# Patient Record
Sex: Female | Born: 1970 | Race: White | Hispanic: No | Marital: Married | Smoking: Never smoker
Health system: Southern US, Community
[De-identification: ages and names within clinical notes are randomized; demographics above are authoritative.]

## PROBLEM LIST (undated history)

## (undated) DIAGNOSIS — Z1589 Genetic susceptibility to other disease: Secondary | ICD-10-CM

## (undated) DIAGNOSIS — E7212 Methylenetetrahydrofolate reductase deficiency: Secondary | ICD-10-CM

## (undated) DIAGNOSIS — I10 Essential (primary) hypertension: Secondary | ICD-10-CM

## (undated) DIAGNOSIS — M797 Fibromyalgia: Secondary | ICD-10-CM

## (undated) HISTORY — DX: Methylenetetrahydrofolate reductase deficiency: E72.12

## (undated) HISTORY — DX: Genetic susceptibility to other disease: Z15.89

## (undated) HISTORY — PX: OTHER SURGICAL HISTORY: SHX169

## (undated) HISTORY — DX: Fibromyalgia: M79.7

## (undated) HISTORY — DX: Essential (primary) hypertension: I10

---

## 1997-08-12 ENCOUNTER — Other Ambulatory Visit: Admission: RE | Admit: 1997-08-12 | Discharge: 1997-08-12 | Payer: Self-pay | Admitting: Obstetrics & Gynecology

## 1999-11-11 ENCOUNTER — Other Ambulatory Visit: Admission: RE | Admit: 1999-11-11 | Discharge: 1999-11-11 | Payer: Self-pay | Admitting: Obstetrics & Gynecology

## 2000-11-10 ENCOUNTER — Other Ambulatory Visit: Admission: RE | Admit: 2000-11-10 | Discharge: 2000-11-10 | Payer: Self-pay | Admitting: Obstetrics and Gynecology

## 2002-12-05 ENCOUNTER — Other Ambulatory Visit: Admission: RE | Admit: 2002-12-05 | Discharge: 2002-12-05 | Payer: Self-pay | Admitting: Obstetrics and Gynecology

## 2003-12-11 ENCOUNTER — Other Ambulatory Visit: Admission: RE | Admit: 2003-12-11 | Discharge: 2003-12-11 | Payer: Self-pay | Admitting: Obstetrics and Gynecology

## 2004-10-14 ENCOUNTER — Encounter: Admission: RE | Admit: 2004-10-14 | Discharge: 2004-10-14 | Payer: Self-pay | Admitting: Internal Medicine

## 2005-01-19 ENCOUNTER — Other Ambulatory Visit: Admission: RE | Admit: 2005-01-19 | Discharge: 2005-01-19 | Payer: Self-pay | Admitting: Obstetrics and Gynecology

## 2005-08-02 ENCOUNTER — Encounter: Admission: RE | Admit: 2005-08-02 | Discharge: 2005-08-02 | Payer: Self-pay | Admitting: Internal Medicine

## 2006-01-11 HISTORY — PX: OVARIAN CYST REMOVAL: SHX89

## 2006-02-12 ENCOUNTER — Emergency Department (HOSPITAL_COMMUNITY): Admission: EM | Admit: 2006-02-12 | Discharge: 2006-02-13 | Payer: Self-pay | Admitting: Emergency Medicine

## 2006-04-12 HISTORY — PX: HYSTEROSCOPY: SHX211

## 2006-06-24 ENCOUNTER — Encounter: Admission: RE | Admit: 2006-06-24 | Discharge: 2006-06-24 | Payer: Self-pay | Admitting: Internal Medicine

## 2006-08-17 ENCOUNTER — Other Ambulatory Visit: Admission: RE | Admit: 2006-08-17 | Discharge: 2006-08-17 | Payer: Self-pay | Admitting: *Deleted

## 2008-01-15 ENCOUNTER — Other Ambulatory Visit: Admission: RE | Admit: 2008-01-15 | Discharge: 2008-01-15 | Payer: Self-pay | Admitting: Obstetrics & Gynecology

## 2009-07-31 ENCOUNTER — Ambulatory Visit (HOSPITAL_COMMUNITY): Admission: RE | Admit: 2009-07-31 | Discharge: 2009-07-31 | Payer: Self-pay | Admitting: Internal Medicine

## 2009-11-10 ENCOUNTER — Encounter: Payer: Self-pay | Admitting: Obstetrics & Gynecology

## 2009-11-10 ENCOUNTER — Ambulatory Visit (HOSPITAL_COMMUNITY): Admission: RE | Admit: 2009-11-10 | Discharge: 2009-11-11 | Payer: Self-pay | Admitting: Obstetrics & Gynecology

## 2009-11-10 HISTORY — PX: LAPAROSCOPIC TOTAL HYSTERECTOMY: SUR800

## 2010-02-10 ENCOUNTER — Other Ambulatory Visit: Payer: Self-pay | Admitting: Dermatology

## 2010-03-24 LAB — CBC
HCT: 32.4 % — ABNORMAL LOW (ref 36.0–46.0)
Hemoglobin: 11.1 g/dL — ABNORMAL LOW (ref 12.0–15.0)
MCH: 28.2 pg (ref 26.0–34.0)
MCHC: 34.3 g/dL (ref 30.0–36.0)
MCV: 82.1 fL (ref 78.0–100.0)
Platelets: 245 10*3/uL (ref 150–400)
RBC: 3.95 MIL/uL (ref 3.87–5.11)
RDW: 13.6 % (ref 11.5–15.5)
WBC: 10.3 10*3/uL (ref 4.0–10.5)

## 2010-03-25 LAB — CBC
HCT: 36.5 % (ref 36.0–46.0)
Hemoglobin: 12.4 g/dL (ref 12.0–15.0)
MCH: 28 pg (ref 26.0–34.0)
MCHC: 34.1 g/dL (ref 30.0–36.0)
MCV: 82.1 fL (ref 78.0–100.0)
Platelets: 280 10*3/uL (ref 150–400)
RBC: 4.45 MIL/uL (ref 3.87–5.11)
RDW: 13.6 % (ref 11.5–15.5)
WBC: 6.9 10*3/uL (ref 4.0–10.5)

## 2010-03-25 LAB — PREGNANCY, URINE: Preg Test, Ur: NEGATIVE

## 2010-03-25 LAB — SURGICAL PCR SCREEN
MRSA, PCR: NEGATIVE
Staphylococcus aureus: POSITIVE — AB

## 2010-06-15 ENCOUNTER — Other Ambulatory Visit: Payer: Self-pay | Admitting: Internal Medicine

## 2010-06-15 DIAGNOSIS — K219 Gastro-esophageal reflux disease without esophagitis: Secondary | ICD-10-CM

## 2010-06-17 ENCOUNTER — Ambulatory Visit
Admission: RE | Admit: 2010-06-17 | Discharge: 2010-06-17 | Disposition: A | Discharge status: Home / Self Care | Payer: 59 | Source: Ambulatory Visit | Attending: Internal Medicine | Admitting: Internal Medicine

## 2010-06-17 DIAGNOSIS — K219 Gastro-esophageal reflux disease without esophagitis: Secondary | ICD-10-CM

## 2011-02-12 DIAGNOSIS — M797 Fibromyalgia: Secondary | ICD-10-CM

## 2011-02-12 HISTORY — DX: Fibromyalgia: M79.7

## 2011-06-23 ENCOUNTER — Other Ambulatory Visit: Payer: Self-pay | Admitting: Obstetrics & Gynecology

## 2011-06-23 DIAGNOSIS — Z1231 Encounter for screening mammogram for malignant neoplasm of breast: Secondary | ICD-10-CM

## 2011-07-01 ENCOUNTER — Ambulatory Visit (HOSPITAL_COMMUNITY)
Admission: RE | Admit: 2011-07-01 | Discharge: 2011-07-01 | Disposition: A | Discharge status: Home / Self Care | Payer: 59 | Source: Ambulatory Visit | Attending: Obstetrics & Gynecology | Admitting: Obstetrics & Gynecology

## 2011-07-01 DIAGNOSIS — Z1231 Encounter for screening mammogram for malignant neoplasm of breast: Secondary | ICD-10-CM | POA: Insufficient documentation

## 2011-07-16 ENCOUNTER — Ambulatory Visit (HOSPITAL_COMMUNITY): Payer: 59

## 2011-07-21 ENCOUNTER — Ambulatory Visit (HOSPITAL_COMMUNITY): Payer: 59

## 2013-02-07 ENCOUNTER — Encounter: Payer: Self-pay | Admitting: Gynecology

## 2013-02-07 DIAGNOSIS — M797 Fibromyalgia: Secondary | ICD-10-CM | POA: Insufficient documentation

## 2013-02-12 ENCOUNTER — Ambulatory Visit: Payer: Self-pay | Admitting: Gynecology

## 2013-03-12 ENCOUNTER — Encounter: Payer: Self-pay | Admitting: Obstetrics & Gynecology

## 2013-03-12 ENCOUNTER — Ambulatory Visit (INDEPENDENT_AMBULATORY_CARE_PROVIDER_SITE_OTHER): Payer: 59 | Admitting: Obstetrics & Gynecology

## 2013-03-12 VITALS — BP 122/78 | HR 64 | Resp 16 | Ht 64.75 in | Wt 173.4 lb

## 2013-03-12 DIAGNOSIS — Z01419 Encounter for gynecological examination (general) (routine) without abnormal findings: Secondary | ICD-10-CM

## 2013-03-12 DIAGNOSIS — Z0000 Encounter for general adult medical examination without abnormal findings: Secondary | ICD-10-CM

## 2013-03-12 LAB — POCT URINALYSIS DIPSTICK
Bilirubin, UA: NEGATIVE
Glucose, UA: NEGATIVE
Ketones, UA: NEGATIVE
Leukocytes, UA: NEGATIVE
Nitrite, UA: NEGATIVE
Protein, UA: NEGATIVE
Urobilinogen, UA: NEGATIVE
pH, UA: 5

## 2013-03-12 MED ORDER — FLUTICASONE PROPIONATE 50 MCG/ACT NA SUSP: 2.0000 | Freq: Every day | NASAL | Status: DC

## 2013-03-12 NOTE — Patient Instructions (Signed)

## 2013-03-12 NOTE — Progress Notes (Signed)
43 y.o. [id number] MarriedCaucasianF here for annual exam.  Work has been much better.  Stress has been much better.  No vaginal bleeding.  Having sinus issues today.  Had floors redone at home and this cause allergies to flare.  Doing reflexology.  Seeing Tammy Worrell as well.  Energy is really good.  Has MTHFR gene mutation.    Patient's last menstrual period was 10/13/2009.          Sexually active: yes  The current method of family planning is vasectomy and TLH Exercising: yes  yoga Smoker:  no  Health Maintenance: Pap:  03/14/09 WNL History of abnormal Pap:  no MMG:  10/23/12 normal Colonoscopy:  none BMD:   none TDaP:  Aware is due, will call back to schedule this later-not feeling well today Screening Labs: PCP, Hb today: PCP, Urine today: RBC-1+   reports that she has never smoked. She has never used smokeless tobacco. She reports that she does not drink alcohol or use illicit drugs.  Past Medical History  Diagnosis Date  . Fibromyalgia 2/13  . Methylenetetrahydrofolate reductase (MTHFR) gene mutation     Past Surgical History  Procedure Laterality Date  . Ovarian cyst removal  2008  . Hysteroscopy  04/2006    D&C   . Abdominal hysterectomy  11/10/09    TLH    Current Outpatient Prescriptions  Medication Sig Dispense Refill  . cetirizine (ZYRTEC) 10 MG tablet Take 10 mg by mouth daily. As needed      . cholecalciferol (VITAMIN D) 1000 UNITS tablet Take 1,000 Units by mouth. 2 daily      . guaiFENesin (MUCINEX) 600 MG 12 hr tablet Take 600 mg by mouth.      . NON FORMULARY MMB daily       No current facility-administered medications for this visit.    Family History  Problem Relation Age of Onset  . Hypertension Father   . Thyroid disease Father     ROS:  Pertinent items are noted in HPI.  Otherwise, a comprehensive ROS was negative.  Exam:   BP 122/78  Pulse 64  Resp 16  Ht 5' 4.75" (1.645 m)  Wt 173 lb 6.4 oz (78.654 kg)  BMI 29.07 kg/m2  LMP  10/13/2009  Weight change: +2lb  Height: 5' 4.75" (164.5 cm)  Ht Readings from Last 3 Encounters:  03/12/13 5' 4.75" (1.645 m)    General appearance: alert, cooperative and appears stated age Head: Normocephalic, without obvious abnormality, atraumatic Neck: no adenopathy, supple, symmetrical, trachea midline and thyroid normal to inspection and palpation Lungs: clear to auscultation bilaterally Breasts: normal appearance, no masses or tenderness Heart: regular rate and rhythm Abdomen: soft, non-tender; bowel sounds normal; no masses,  no organomegaly Extremities: extremities normal, atraumatic, no cyanosis or edema Skin: Skin color, texture, turgor normal. No rashes or lesions Lymph nodes: Cervical, supraclavicular, and axillary nodes normal. No abnormal inguinal nodes palpated Neurologic: Grossly normal   Pelvic: External genitalia:  no lesions              Urethra:  normal appearing urethra with no masses, tenderness or lesions              Bartholins and Skenes: normal                 Vagina: normal appearing vagina with normal color and discharge, no lesions              Cervix: absent  Pap taken: no Bimanual Exam:  Uterus:  uterus absent              Adnexa: normal adnexa and no mass, fullness, tenderness               Rectovaginal: Confirms               Anus:  normal sphincter tone, no lesions  A:  Well Woman with normal exam Fibromyalgia H/O microscopic hematuria on dip u/a, urinalysis was negative MTHFR heterozygous gene positive testing 2014 Recent sinus issues  P:   Mammogram yearly. pap smear not indicated. Labs with Dr. Chalmers Cater, Tammy Worrell Flonase rx to pharmacy. return annually or prn  An After Visit Summary was printed and given to the patient.

## 2013-11-12 ENCOUNTER — Encounter: Payer: Self-pay | Admitting: Obstetrics & Gynecology

## 2014-03-22 ENCOUNTER — Ambulatory Visit (INDEPENDENT_AMBULATORY_CARE_PROVIDER_SITE_OTHER): Payer: 59 | Admitting: Obstetrics & Gynecology

## 2014-03-22 ENCOUNTER — Encounter: Payer: Self-pay | Admitting: Obstetrics & Gynecology

## 2014-03-22 VITALS — BP 104/64 | HR 80 | Resp 16 | Ht 64.75 in | Wt 171.4 lb

## 2014-03-22 DIAGNOSIS — Z01419 Encounter for gynecological examination (general) (routine) without abnormal findings: Secondary | ICD-10-CM | POA: Diagnosis not present

## 2014-03-22 NOTE — Progress Notes (Signed)
44 y.o. [id number] MarriedCaucasianF here for annual exam.  Doing well.  States she feels really well.  No vaginal bleeding.    Did blood work last work.  Panel was done last week.    Patient's last menstrual period was 10/13/2009.          Sexually active: Yes.    The current method of family planning is vasectomy and status post hysterectomy.    Exercising: Yes.    yoga and walking Smoker:  no  Health Maintenance: Pap:  03/14/09 WNL History of abnormal Pap:  no MMG:  12/29/13 3D-MMG, 01/07/14 left breast US-normal Colonoscopy:  none BMD:   none TDaP:  declines Screening Labs: with MMB, Hb today: with MMB, Urine today: declined   reports that she has never smoked. She has never used smokeless tobacco. She reports that she drinks alcohol. She reports that she does not use illicit drugs.  Past Medical History  Diagnosis Date  . Fibromyalgia 2/13  . Methylenetetrahydrofolate reductase (MTHFR) gene mutation     heterozygous    Past Surgical History  Procedure Laterality Date  . Ovarian cyst removal  2008  . Hysteroscopy  04/2006    D&C   . Laparoscopic total hysterectomy  11/10/09    TLH    Current Outpatient Prescriptions  Medication Sig Dispense Refill  . cholecalciferol (VITAMIN D) 1000 UNITS tablet Take 1,000 Units by mouth. 2 daily    . NON FORMULARY MMB daily     No current facility-administered medications for this visit.    Family History  Problem Relation Age of Onset  . Hypertension Father   . Thyroid disease Father     ROS:  Pertinent items are noted in HPI.  Otherwise, a comprehensive ROS was negative.  Exam:   General appearance: alert, cooperative and appears stated age Head: Normocephalic, without obvious abnormality, atraumatic Neck: no adenopathy, supple, symmetrical, trachea midline and thyroid normal to inspection and palpation Lungs: clear to auscultation bilaterally Breasts: normal appearance, no masses or tenderness Heart: regular rate and  rhythm Abdomen: soft, non-tender; bowel sounds normal; no masses,  no organomegaly Extremities: extremities normal, atraumatic, no cyanosis or edema Skin: Skin color, texture, turgor normal. No rashes or lesions Lymph nodes: Cervical, supraclavicular, and axillary nodes normal. No abnormal inguinal nodes palpated Neurologic: Grossly normal   Pelvic: External genitalia:  no lesions              Urethra:  normal appearing urethra with no masses, tenderness or lesions              Bartholins and Skenes: normal                 Vagina: normal appearing vagina with normal color and discharge, no lesions              Cervix: absent              Pap taken: No. Bimanual Exam:  Uterus:  uterus absent              Adnexa: normal adnexa and no mass, fullness, tenderness               Rectovaginal: Confirms               Anus:  normal sphincter tone, no lesions  Chaperone was present for exam.  A:  Well Woman with normal exam Fibromyalgia MTHFR heterozygous gene positive testing 2014 Dense breast tissue  P: Mammogram yearly. pap smear not  indicated. Labs with Dr. Chalmers Cater, Tammy Worrell Flonase rx to pharmacy. return annually or prn

## 2015-01-12 HISTORY — PX: LIPOSUCTION: SHX10

## 2015-01-12 HISTORY — PX: BREAST REDUCTION SURGERY: SHX8

## 2015-05-27 ENCOUNTER — Encounter: Payer: Self-pay | Admitting: Obstetrics & Gynecology

## 2015-05-27 ENCOUNTER — Ambulatory Visit (INDEPENDENT_AMBULATORY_CARE_PROVIDER_SITE_OTHER): Payer: 59 | Admitting: Obstetrics & Gynecology

## 2015-05-27 VITALS — BP 98/64 | HR 88 | Resp 14 | Ht 64.75 in | Wt 163.2 lb

## 2015-05-27 DIAGNOSIS — Z01419 Encounter for gynecological examination (general) (routine) without abnormal findings: Secondary | ICD-10-CM | POA: Diagnosis not present

## 2015-05-27 NOTE — Patient Instructions (Signed)
Jade Gosling, MD.  [place].

## 2015-05-27 NOTE — Progress Notes (Signed)
45 y.o. [id number] MarriedCaucasianF here for annual exam.  Doing well.  Had breast reduction.  Also, did liposuction on back and abdomen.  Saw a Dr. Berline Lopes and was so pleased.    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    Patient's last menstrual period was 10/13/2009.          Sexually active: Yes.    The current method of family planning is status post hysterectomy.    Exercising: Yes.    walking and yoga Smoker:  no  Health Maintenance: Pap:  3/11 History of abnormal Pap:  no MMG:  01/22/15 BIRADS 1 negative  Colonoscopy:  none BMD:   none TDaP:  declined  Screening Labs: declined, Hb today: declined, Urine today: declined   reports that she has never smoked. She has never used smokeless tobacco. She reports that she drinks alcohol. She reports that she does not use illicit drugs.  Past Medical History  Diagnosis Date  . Fibromyalgia 2/13  . Methylenetetrahydrofolate reductase (MTHFR) gene mutation     heterozygous    Past Surgical History  Procedure Laterality Date  . Ovarian cyst removal  2008  . Hysteroscopy  04/2006    D&C   . Laparoscopic total hysterectomy  11/10/09    TLH    Current Outpatient Prescriptions  Medication Sig Dispense Refill  . cholecalciferol (VITAMIN D) 1000 UNITS tablet Take 1,000 Units by mouth. 2 daily    . NON FORMULARY MMB daily     No current facility-administered medications for this visit.    Family History  Problem Relation Age of Onset  . Hypertension Father   . Thyroid disease Father     ROS:  Pertinent items are noted in HPI.  Otherwise, a comprehensive ROS was negative.  Exam:   General appearance: alert, cooperative and appears stated age Head: Normocephalic, without obvious abnormality, atraumatic Neck: no adenopathy, supple, symmetrical, trachea midline and thyroid normal to inspection and palpation Lungs: clear to auscultation bilaterally Breasts: normal appearance, no masses or tenderness Heart: regular rate and  rhythm Abdomen: soft, non-tender; bowel sounds normal; no masses,  no organomegaly Extremities: extremities normal, atraumatic, no cyanosis or edema Skin: Skin color, texture, turgor normal. No rashes or lesions Lymph nodes: Cervical, supraclavicular, and axillary nodes normal. No abnormal inguinal nodes palpated Neurologic: Grossly normal   Pelvic: External genitalia:  no lesions              Urethra:  normal appearing urethra with no masses, tenderness or lesions              Bartholins and Skenes: normal                 Vagina: normal appearing vagina with normal color and discharge, no lesions              Cervix: absent              Pap taken: No. Bimanual Exam:  Uterus:  uterus absent              Adnexa: normal adnexa and no mass, fullness, tenderness               Rectovaginal: Confirms               Anus:  normal sphincter tone, no lesions  Chaperone was present for exam.  A:  Well Woman with normal exam Fibromyalgia Recent breast reduction and liposuction MTHFR heterozygous gene positive testing 2014 Dense breast tissue  P: Mammogram yearly.  Doing 3D MMG. pap smear not indicated Labs with [facility] Kinds.  Name given to establish care with new PCP.  Prior PCP, Dr. Minna Antis, moved to [place]. Return annually or prn

## 2015-10-09 ENCOUNTER — Telehealth: Payer: Self-pay | Admitting: Obstetrics & Gynecology

## 2015-10-09 NOTE — Telephone Encounter (Signed)
Spoke with patient. Patient request name of PCP provided by Dr. Sabra Heck at last AEX. Provided patient with info: Dr. Billey Gosling at [place]; [phone].   Routing to provider for final review. Patient is agreeable to disposition. Will close encounter.

## 2015-10-09 NOTE — Telephone Encounter (Signed)
Patient was given the name of a pcp at her last visit but can not remember the name.

## 2016-02-02 DIAGNOSIS — J029 Acute pharyngitis, unspecified: Secondary | ICD-10-CM | POA: Diagnosis not present

## 2016-02-04 DIAGNOSIS — R05 Cough: Secondary | ICD-10-CM | POA: Diagnosis not present

## 2016-02-04 DIAGNOSIS — J029 Acute pharyngitis, unspecified: Secondary | ICD-10-CM | POA: Diagnosis not present

## 2016-05-12 DIAGNOSIS — R5383 Other fatigue: Secondary | ICD-10-CM | POA: Diagnosis not present

## 2016-05-21 HISTORY — PX: LIPOSUCTION: SHX10

## 2016-06-21 ENCOUNTER — Ambulatory Visit (INDEPENDENT_AMBULATORY_CARE_PROVIDER_SITE_OTHER): Payer: 59 | Admitting: Obstetrics & Gynecology

## 2016-06-21 ENCOUNTER — Encounter: Payer: Self-pay | Admitting: Obstetrics & Gynecology

## 2016-06-21 ENCOUNTER — Other Ambulatory Visit (HOSPITAL_COMMUNITY)
Admission: RE | Admit: 2016-06-21 | Discharge: 2016-06-21 | Disposition: A | Discharge status: Home / Self Care | Payer: 59 | Source: Ambulatory Visit | Attending: Obstetrics & Gynecology | Admitting: Obstetrics & Gynecology

## 2016-06-21 VITALS — BP 122/68 | HR 80 | Resp 16 | Ht 64.75 in | Wt 168.0 lb

## 2016-06-21 DIAGNOSIS — Z86010 Personal history of colonic polyps: Secondary | ICD-10-CM

## 2016-06-21 DIAGNOSIS — Z01419 Encounter for gynecological examination (general) (routine) without abnormal findings: Secondary | ICD-10-CM | POA: Insufficient documentation

## 2016-06-21 DIAGNOSIS — Z124 Encounter for screening for malignant neoplasm of cervix: Secondary | ICD-10-CM | POA: Diagnosis not present

## 2016-06-21 NOTE — Progress Notes (Signed)
46 y.o. [id number] MarriedCaucasianF here for annual exam.  Doing well.  Had additional liposuction done about a month ago.  Doing PT to help with lumpiness after the procedure.    Denies vaginal bleeding.  D/W pt new guideline for colonoscopy screening from [place].  Pt reports having colon polyps in her 20's after her first delivery (around 1998).  She had hemorrhoids and the polyps were discovered when evaluation and treatment was being done.  This is new information to me.  Feel referral appropriate at this time.    Had blood work done this summer.  Patient's last menstrual period was 10/13/2009.          Sexually active: Yes.    The current method of family planning is status post hysterectomy.    Exercising: Yes.    light weights and walking Smoker:  no  Health Maintenance: Pap:  03/14/09 Normal  History of abnormal Pap:  no MMG:  01/22/15 BIRADS1:neg -- patient will schedule Colonoscopy:  never BMD:   Never TDaP:  Done in the 90s per patient; patient declines Pneumonia vaccine(s):  N/A Zostavax:   N/A Hep C testing: N/A Screening Labs: PCP takes care of labs   reports that she has never smoked. She has never used smokeless tobacco. She reports that she drinks alcohol. She reports that she does not use drugs.  Past Medical History:  Diagnosis Date  . Fibromyalgia 2/13  . 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 BREAST REDUCTION SURGERY  01/2015   . HYSTEROSCOPY  04/2006   D&C   . LAPAROSCOPIC TOTAL HYSTERECTOMY  11/10/09   TLH  . LIPOSUCTION  01/2015    full torso   . LIPOSUCTION  05/21/2016  . OVARIAN CYST REMOVAL  2008    Current Outpatient Prescriptions  Medication Sig Dispense Refill  . NON FORMULARY MMB daily     No current facility-administered medications for this visit.     Family History  Problem Relation Age of Onset  . Hypertension Father    . Thyroid disease Father   . Diabetes Father   . Diabetes Mother     ROS:  Pertinent items are noted in HPI.  Otherwise, a comprehensive ROS was negative.  Exam:   BP 122/68 (BP Location: Right Arm, Patient Position: Sitting, Cuff Size: Normal)   Pulse 80   Resp 16   Ht 5' 4.75" (1.645 m)   Wt 168 lb (76.2 kg)   LMP 10/13/2009   BMI 28.17 kg/m   Height: 5' 4.75" (164.5 cm)  Ht Readings from Last 3 Encounters:  06/21/16 5' 4.75" (1.645 m)  05/27/15 5' 4.75" (1.645 m)  03/22/14 5' 4.75" (1.645 m)   General appearance: alert, cooperative and appears stated age Head: Normocephalic, without obvious abnormality, atraumatic Neck: no adenopathy, supple, symmetrical, trachea midline and thyroid normal to inspection and palpation Lungs: clear to auscultation bilaterally Breasts: normal appearance, no masses or tenderness Heart: regular rate and rhythm Abdomen: soft, non-tender; bowel sounds normal; no masses,  no organomegaly Extremities: extremities normal, atraumatic, no cyanosis or edema Skin: Skin color, texture, turgor normal. No rashes or lesions Lymph nodes: Cervical, supraclavicular, and axillary nodes normal. No abnormal inguinal nodes palpated Neurologic: Grossly normal   Pelvic: External genitalia:  no lesions              Urethra:  normal appearing urethra with no masses, tenderness or  lesions              Bartholins and Skenes: normal                 Vagina: normal appearing vagina with normal color and discharge, no lesions              Cervix: absent              Pap taken: Yes.   Bimanual Exam:  Uterus:  uterus absent              Adnexa: normal adnexa and no mass, fullness, tenderness               Rectovaginal: Confirms               Anus:  normal sphincter tone, no lesions  Chaperone was present for exam.  A:  Well Woman with normal exam Fibromyalgia H/O breast reduction and liposuction x 2 MTHRF heterozygous gene positive testing 2014 Dense breast  tissue, doing 3D MMG  P:   Mammogram yearly.  Pt aware this is due. pap smear obtained today Plan lipid testing next year Referral for possible colonoscopy Return annually or prn

## 2016-06-22 LAB — CYTOLOGY - PAP: Diagnosis: NEGATIVE

## 2016-07-01 DIAGNOSIS — Z8379 Family history of other diseases of the digestive system: Secondary | ICD-10-CM | POA: Diagnosis not present

## 2016-07-01 DIAGNOSIS — Z1211 Encounter for screening for malignant neoplasm of colon: Secondary | ICD-10-CM | POA: Diagnosis not present

## 2016-07-05 DIAGNOSIS — R05 Cough: Secondary | ICD-10-CM | POA: Diagnosis not present

## 2016-07-05 DIAGNOSIS — J069 Acute upper respiratory infection, unspecified: Secondary | ICD-10-CM | POA: Diagnosis not present

## 2016-07-23 DIAGNOSIS — Z1231 Encounter for screening mammogram for malignant neoplasm of breast: Secondary | ICD-10-CM | POA: Diagnosis not present

## 2016-09-03 ENCOUNTER — Ambulatory Visit: Payer: 59 | Admitting: Obstetrics & Gynecology

## 2016-09-29 ENCOUNTER — Encounter: Payer: Self-pay | Admitting: Obstetrics & Gynecology

## 2017-01-21 DIAGNOSIS — M9903 Segmental and somatic dysfunction of lumbar region: Secondary | ICD-10-CM | POA: Diagnosis not present

## 2017-01-21 DIAGNOSIS — M9905 Segmental and somatic dysfunction of pelvic region: Secondary | ICD-10-CM | POA: Diagnosis not present

## 2017-01-21 DIAGNOSIS — M9902 Segmental and somatic dysfunction of thoracic region: Secondary | ICD-10-CM | POA: Diagnosis not present

## 2017-02-22 DIAGNOSIS — L820 Inflamed seborrheic keratosis: Secondary | ICD-10-CM | POA: Diagnosis not present

## 2017-02-22 DIAGNOSIS — D225 Melanocytic nevi of trunk: Secondary | ICD-10-CM | POA: Diagnosis not present

## 2017-02-22 DIAGNOSIS — L814 Other melanin hyperpigmentation: Secondary | ICD-10-CM | POA: Diagnosis not present

## 2017-03-16 ENCOUNTER — Telehealth: Payer: Self-pay | Admitting: Obstetrics & Gynecology

## 2017-03-16 NOTE — Telephone Encounter (Signed)
Patient thinks she "has something going on with her thyroid". Patient would like blood work.

## 2017-03-16 NOTE — Telephone Encounter (Signed)
Left message to call Sharee Pimple at [phone].

## 2017-03-16 NOTE — Telephone Encounter (Signed)
Spoke with patient.Reports recent changes in hair texture noted by hairdresser. Patient reports soreness in left side of neck when turning and pressing, no lumps, "feels like swelling is deep". Patient is concerned d/t family hx of thyroid cancer, requesting OV.   Denies fever/chill, cold symptoms.  Recommended patient not to press on area of concern.  OV scheduled for 03/21/17 at 2:30pm with Dr. Sabra Heck. Patient declined appt offered for 3/11.   Routing to provider for final review. Patient is agreeable to disposition. Will close encounter.

## 2017-03-21 NOTE — Telephone Encounter (Addendum)
Call to patient. Advised that we are happy to see patient as scheduled; however, based on symptoms would likely need to refer her to another provider for additional evaluation.  Any testing, even if normal, would still warrant PCP follow-up. Patient states she prefers to come here first and will go to another provider after seeing Dr Sabra Heck. Appointment for 03-24-17 as previously scheduled.

## 2017-03-21 NOTE — Telephone Encounter (Signed)
Gay Filler, Would you review this?  Pt has appt on Thursday but I think PCP may be a better option. Thanks.

## 2017-03-24 ENCOUNTER — Encounter: Payer: Self-pay | Admitting: Obstetrics & Gynecology

## 2017-03-24 ENCOUNTER — Ambulatory Visit (INDEPENDENT_AMBULATORY_CARE_PROVIDER_SITE_OTHER): Payer: 59 | Admitting: Obstetrics & Gynecology

## 2017-03-24 VITALS — BP 120/88 | HR 84 | Temp 98.2°F | Resp 14 | Wt 167.0 lb

## 2017-03-24 DIAGNOSIS — L679 Hair color and hair shaft abnormality, unspecified: Secondary | ICD-10-CM

## 2017-03-24 DIAGNOSIS — R599 Enlarged lymph nodes, unspecified: Secondary | ICD-10-CM | POA: Diagnosis not present

## 2017-03-24 NOTE — Progress Notes (Signed)
GYNECOLOGY  VISIT  CC:   H/o enlarged lymph node  HPI: 47 y.o. [id number] Married Caucasian female here for swollen lymp node/neck mass that she noted last week.  Noted it was tender as well.  She did have a severe URI about two weeks ago.  Took mucinex and tylenol sinus.  Wasn't on antibiotics and Pt called for appt.  Was advised should see PCP.  She was adamant about being seen here.    GYNECOLOGIC HISTORY: Patient's last menstrual period was 10/13/2009. Contraception: hysterectomy  Menopausal hormone therapy: none  Patient Active Problem List   Diagnosis Date Noted  . Fibromyalgia     Past Medical History:  Diagnosis Date  . Fibromyalgia 2/13  . 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 BREAST REDUCTION SURGERY  01/2015   . HYSTEROSCOPY  04/2006   D&C   . LAPAROSCOPIC TOTAL HYSTERECTOMY  11/10/09   TL[facility]  . LIPOSUCTION  01/2015    full torso   . LIPOSUCTION  05/21/2016  . OVARIAN CYST REMOVAL  2008    MEDS:   Current Outpatient Medications on File Prior to Visit  Medication Sig Dispense Refill  . NON FORMULARY MMB daily     No current facility-administered medications on file prior to visit.     ALLERGIES: Azithromycin; Codeine; and Synthroid [levothyroxine sodium]  Family History  Problem Relation Age of Onset  . Hypertension Father   . Thyroid disease Father   . Diabetes Father   . Diabetes Mother     SH:  Married, non smoker  Review of Systems  All other systems reviewed and are negative.   PHYSICAL EXAMINATION:    BP 120/88 (BP Location: Right Arm, Patient Position: Sitting, Cuff Size: Normal)   Pulse 84   Temp 98.2 F (36.8 C) (Oral)   Resp 14   Wt 167 lb (75.8 kg)   LMP 10/13/2009   BMI 28.01 kg/m      Physical Exam  Constitutional: She is oriented to person, place, and time. She appears well-developed and well-nourished.  Neck: Normal range of  motion. Neck supple. No tracheal deviation present. No thyromegaly present.  Lymphadenopathy:    She has no cervical adenopathy.  Neurological: She is alert and oriented to person, place, and time.  Skin: Skin is warm and dry.  Psychiatric: She has a normal mood and affect.   Assessment: H/o enlarged lymph node Hair changes  Plan: CBC with diff will be obtained today TSH with panel, T4

## 2017-03-24 NOTE — Addendum Note (Signed)
Addended by: Megan Salon on: 03/24/2017 01:46 PM   Modules accepted: Orders

## 2017-03-25 LAB — CBC WITH DIFFERENTIAL/PLATELET
Basophils Absolute: 0 10*3/uL (ref 0.0–0.2)
Basos: 0 %
EOS (ABSOLUTE): 0.3 10*3/uL (ref 0.0–0.4)
Eos: 3 %
Hematocrit: 40.1 % (ref 34.0–46.6)
Hemoglobin: 13.4 g/dL (ref 11.1–15.9)
Immature Grans (Abs): 0 10*3/uL (ref 0.0–0.1)
Immature Granulocytes: 0 %
Lymphocytes Absolute: 2.3 10*3/uL (ref 0.7–3.1)
Lymphs: 28 %
MCH: 27.9 pg (ref 26.6–33.0)
MCHC: 33.4 g/dL (ref 31.5–35.7)
MCV: 83 fL (ref 79–97)
Monocytes Absolute: 0.4 10*3/uL (ref 0.1–0.9)
Monocytes: 5 %
Neutrophils Absolute: 5.2 10*3/uL (ref 1.4–7.0)
Neutrophils: 64 %
Platelets: 329 10*3/uL (ref 150–379)
RBC: 4.81 x10E6/uL (ref 3.77–5.28)
RDW: 13.9 % (ref 12.3–15.4)
WBC: 8.2 10*3/uL (ref 3.4–10.8)

## 2017-03-25 LAB — THYROID PANEL WITH TSH
Free Thyroxine Index: 2.1 (ref 1.2–4.9)
T3 Uptake Ratio: 27 % (ref 24–39)
T4, Total: 7.8 ug/dL (ref 4.5–12.0)
TSH: 2.91 u[IU]/mL (ref 0.450–4.500)

## 2017-03-25 LAB — T4, FREE: Free T4: 1.43 ng/dL (ref 0.82–1.77)

## 2017-06-23 ENCOUNTER — Ambulatory Visit: Payer: 59 | Admitting: Obstetrics & Gynecology

## 2017-08-05 ENCOUNTER — Ambulatory Visit: Payer: 59 | Admitting: Obstetrics & Gynecology

## 2017-08-08 ENCOUNTER — Ambulatory Visit: Payer: 59 | Admitting: Obstetrics & Gynecology

## 2017-08-08 ENCOUNTER — Other Ambulatory Visit: Payer: Self-pay

## 2017-08-08 ENCOUNTER — Encounter: Payer: Self-pay | Admitting: Obstetrics & Gynecology

## 2017-08-08 VITALS — BP 120/76 | HR 68 | Resp 16 | Ht 64.75 in | Wt 167.2 lb

## 2017-08-08 DIAGNOSIS — Z1211 Encounter for screening for malignant neoplasm of colon: Secondary | ICD-10-CM

## 2017-08-08 DIAGNOSIS — Z01419 Encounter for gynecological examination (general) (routine) without abnormal findings: Secondary | ICD-10-CM

## 2017-08-08 DIAGNOSIS — E7889 Other lipoprotein metabolism disorders: Secondary | ICD-10-CM | POA: Diagnosis not present

## 2017-08-08 DIAGNOSIS — Z0000 Encounter for general adult medical examination without abnormal findings: Secondary | ICD-10-CM

## 2017-08-08 NOTE — Progress Notes (Signed)
47 y.o. [id number] MarriedCaucasianF here for annual exam.  Doing well.  In the past three months, she's had new itching inside her breast.  This is only in her left breast.    Denies vaginal bleeding.  PCP: Horald Pollen  Patient's last menstrual period was 10/13/2009.          Sexually active: Yes.    The current method of family planning is status post hysterectomy.    Exercising: Yes.    walking, light weights  Smoker:  no  Health Maintenance: Pap:  06/21/16 neg  History of abnormal Pap:  no MMG:  07/23/16 BIRADS1:Neg. Had one 08/05/17 Colonoscopy:  Never BMD:   Never TDaP:  Due. Pt declines.  Risks  Screening Labs: PCP   reports that she has never smoked. She has never used smokeless tobacco. She reports that she drinks about 0.6 oz of alcohol per week. She reports that she does not use drugs.  Past Medical History:  Diagnosis Date  . Fibromyalgia 2/13  . 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 BREAST REDUCTION SURGERY  01/2015   . HYSTEROSCOPY  04/2006   D&C   . LAPAROSCOPIC TOTAL HYSTERECTOMY  11/10/09   [facility]  . LIPOSUCTION  01/2015    full torso   . LIPOSUCTION  05/21/2016  . OVARIAN CYST REMOVAL  2008    Current Outpatient Medications  Medication Sig Dispense Refill  . NON FORMULARY MMB daily     No current facility-administered medications for this visit.     Family History  Problem Relation Age of Onset  . Hypertension Father   . Thyroid disease Father   . Diabetes Father   . Diabetes Mother     Review of Systems  All other systems reviewed and are negative.   Exam:   BP 120/76 (BP Location: Right Arm, Patient Position: Sitting, Cuff Size: Normal)   Pulse 68   Resp 16   Ht 5' 4.75" (1.645 m)   Wt 167 lb 3.2 oz (75.8 kg)   LMP 10/13/2009   BMI 28.04 kg/m     Height: 5' 4.75" (164.5 cm)  Ht Readings from Last 3 Encounters:  08/08/17 5' 4.75" (1.645  m)  06/21/16 5' 4.75" (1.645 m)  05/27/15 5' 4.75" (1.645 m)    General appearance: alert, cooperative and appears stated age Head: Normocephalic, without obvious abnormality, atraumatic Neck: no adenopathy, supple, symmetrical, trachea midline and thyroid normal to inspection and palpation Lungs: clear to auscultation bilaterally Breasts: normal appearance, no masses or tenderness Heart: regular rate and rhythm Abdomen: soft, non-tender; bowel sounds normal; no masses,  no organomegaly Extremities: extremities normal, atraumatic, no cyanosis or edema Skin: Skin color, texture, turgor normal. No rashes or lesions Lymph nodes: Cervical, supraclavicular, and axillary nodes normal. No abnormal inguinal nodes palpated Neurologic: Grossly normal   Pelvic: External genitalia:  no lesions              Urethra:  normal appearing urethra with no masses, tenderness or lesions              Bartholins and Skenes: normal                 Vagina: normal appearing vagina with normal color and discharge, no lesions              Cervix: absent  Pap taken: No. Bimanual Exam:  Uterus:  uterus absent              Adnexa: no mass, fullness, tenderness               Rectovaginal: Confirms               Anus:  normal sphincter tone, no lesions  Chaperone was present for exam.  A:  Well Woman with normal exam Fibromyalgia H/O breast reduction and liposuction x 2 MTHFR heterozygous gene positive testing 2014 Dense breast tissue Breast itching  P:   Mammogram done on Friday pap smear neg 2018 IFOB given today Lab work obtained today:  CMP, Lipids, Vit D Recommended she follow up with breast surgeon as well Tdap due.  She declines this right now. return annually or prn

## 2017-08-09 LAB — COMPREHENSIVE METABOLIC PANEL
A/G RATIO: 1.7 (ref 1.2–2.2)
ALK PHOS: 53 IU/L (ref 39–117)
ALT: 11 IU/L (ref 0–32)
AST: 11 IU/L (ref 0–40)
Albumin: 4.5 g/dL (ref 3.5–5.5)
BUN/Creatinine Ratio: 15 (ref 9–23)
BUN: 13 mg/dL (ref 6–24)
Bilirubin Total: 0.4 mg/dL (ref 0.0–1.2)
CALCIUM: 9.2 mg/dL (ref 8.7–10.2)
CO2: 24 mmol/L (ref 20–29)
Chloride: 101 mmol/L (ref 96–106)
Creatinine, Ser: 0.86 mg/dL (ref 0.57–1.00)
GFR calc Af Amer: 94 mL/min/{1.73_m2} (ref >59)
GFR, EST NON AFRICAN AMERICAN: 81 mL/min/{1.73_m2} (ref >59)
Globulin, Total: 2.7 g/dL (ref 1.5–4.5)
Glucose: 103 mg/dL — ABNORMAL HIGH (ref 65–99)
Potassium: 4.7 mmol/L (ref 3.5–5.2)
Sodium: 139 mmol/L (ref 134–144)
Total Protein: 7.2 g/dL (ref 6.0–8.5)

## 2017-08-09 LAB — LIPID PANEL
CHOL/HDL RATIO: 5.4 ratio — AB (ref 0.0–4.4)
CHOLESTEROL TOTAL: 236 mg/dL — AB (ref 100–199)
HDL: 44 mg/dL (ref >39)
LDL Calculated: 132 mg/dL — ABNORMAL HIGH (ref 0–99)
TRIGLYCERIDES: 299 mg/dL — AB (ref 0–149)
VLDL Cholesterol Cal: 60 mg/dL — ABNORMAL HIGH (ref 5–40)

## 2017-08-09 LAB — VITAMIN D 25 HYDROXY (VIT D DEFICIENCY, FRACTURES): Vit D, 25-Hydroxy: 15.9 ng/mL — ABNORMAL LOW (ref 30.0–100.0)

## 2017-08-11 ENCOUNTER — Other Ambulatory Visit: Payer: Self-pay | Admitting: *Deleted

## 2017-08-11 DIAGNOSIS — R7301 Impaired fasting glucose: Secondary | ICD-10-CM

## 2017-08-11 DIAGNOSIS — E559 Vitamin D deficiency, unspecified: Secondary | ICD-10-CM

## 2017-08-11 MED ORDER — VITAMIN D (ERGOCALCIFEROL) 1.25 MG (50000 UNIT) PO CAPS: 50000.0000 [IU] | ORAL_CAPSULE | ORAL | 0 refills | Status: DC

## 2017-08-13 NOTE — Addendum Note (Signed)
Addended by: Megan Salon on: 08/13/2017 02:24 AM   Modules accepted: Orders

## 2017-08-25 ENCOUNTER — Encounter: Payer: Self-pay | Admitting: Obstetrics & Gynecology

## 2017-08-30 ENCOUNTER — Telehealth: Payer: Self-pay | Admitting: Obstetrics & Gynecology

## 2017-08-30 NOTE — Telephone Encounter (Signed)
Please advise her not to take this in this dosage again.  Once symptoms resolve, she can restart this with 2000 to 3000 IU daily.  Just take OTC for this.

## 2017-08-30 NOTE — Telephone Encounter (Signed)
Spoke with patient. Took Vit D 50,000 IU on 8/18, this was her 2nd dose. Patient reports dizziness, nausea, weakness, feeling groggy and "heart palpitations" on 8/19. At work today, dizziness has resolved, "heart palpitation" still present.  Feels anxious, read a lot about vitamin D  "overdose" and is concerned. Applied magnesium to feet and hands last night and this morning, has increased Gatorade intake.   Recommended patient stop Vit D. Offered OV today with Dr. Sabra Heck, patient declined. Advised to continue to monitor symptoms, if symptoms worsen, new symptoms develop or symptoms do not resolve, return call to office, Urgent care or ER after hours. Will review with Dr. Sabra Heck and return call with additional recommendations.   Dr. Sabra Heck -please advise.

## 2017-08-30 NOTE — Telephone Encounter (Signed)
Patient started taking 50,000 units of vitamin D and over the weekend started feeling nauseus, weak and dizzy.

## 2017-08-30 NOTE — Telephone Encounter (Signed)
Spoke with patient, advised per Dr. Sabra Heck. Patient verbalizes understanding and is agreeable. Encounter closed.

## 2017-11-17 ENCOUNTER — Other Ambulatory Visit (INDEPENDENT_AMBULATORY_CARE_PROVIDER_SITE_OTHER): Payer: 59

## 2017-11-17 DIAGNOSIS — R7301 Impaired fasting glucose: Secondary | ICD-10-CM

## 2017-11-17 DIAGNOSIS — E7889 Other lipoprotein metabolism disorders: Secondary | ICD-10-CM

## 2017-11-17 DIAGNOSIS — E559 Vitamin D deficiency, unspecified: Secondary | ICD-10-CM

## 2017-11-18 LAB — HEMOGLOBIN A1C
Est. average glucose Bld gHb Est-mCnc: 111 mg/dL
HEMOGLOBIN A1C: 5.5 % (ref 4.8–5.6)

## 2017-11-18 LAB — LIPID PANEL
CHOL/HDL RATIO: 4.6 ratio — AB (ref 0.0–4.4)
Cholesterol, Total: 196 mg/dL (ref 100–199)
HDL: 43 mg/dL (ref >39)
LDL CALC: 115 mg/dL — AB (ref 0–99)
TRIGLYCERIDES: 192 mg/dL — AB (ref 0–149)
VLDL Cholesterol Cal: 38 mg/dL (ref 5–40)

## 2017-11-18 LAB — VITAMIN D 25 HYDROXY (VIT D DEFICIENCY, FRACTURES): VIT D 25 HYDROXY: 18.2 ng/mL — AB (ref 30.0–100.0)

## 2017-11-21 ENCOUNTER — Telehealth: Payer: Self-pay | Admitting: *Deleted

## 2017-11-21 NOTE — Telephone Encounter (Signed)
Spoke with patinet and message from Dr. Sabra Heck given.  She verbalizes understanding of results.  Will follow up as scheduled.  She states she is not taking Vit D 50k IU due to side effects but she will continue with her OTC vit D.

## 2017-11-21 NOTE — Telephone Encounter (Signed)
-----   Message from Megan Salon, MD sent at 11/18/2017  5:50 PM EST ----- Please let pt know her cholesterol was 196 (was 236 with last test).  Triglycerides were 299 and were 192.  LDls were 546 and were 115.  Ok to watch.  Needs to do fasting lipid tests only in the future.  Diabetes testing was negative as HbA1C was 5.5.  Lastly.  Vit D was 18.  This is mildly increased from the level of 15 obtained 3 months ago.  She should continue the 50K weekly dosing and I will retest this again at her AEX.

## 2017-11-21 NOTE — Telephone Encounter (Signed)
LM for pt to call back. Please route to Triage if I am not available.

## 2018-02-09 ENCOUNTER — Other Ambulatory Visit: Payer: Self-pay | Admitting: Family Medicine

## 2018-02-09 DIAGNOSIS — R1032 Left lower quadrant pain: Secondary | ICD-10-CM

## 2018-02-10 ENCOUNTER — Other Ambulatory Visit: Payer: 59

## 2018-02-10 ENCOUNTER — Other Ambulatory Visit: Payer: Self-pay | Admitting: Family Medicine

## 2018-02-10 DIAGNOSIS — R319 Hematuria, unspecified: Secondary | ICD-10-CM

## 2018-02-10 DIAGNOSIS — R1032 Left lower quadrant pain: Secondary | ICD-10-CM

## 2018-02-14 ENCOUNTER — Telehealth: Payer: Self-pay | Admitting: Obstetrics & Gynecology

## 2018-02-14 NOTE — Telephone Encounter (Signed)
Agree with recommendations.  Precautions have been given.  Ok to close encounter.

## 2018-02-14 NOTE — Telephone Encounter (Signed)
Patient is calling regarding UTI symptoms. Patient stated that she is experiencing cramping, pain in her lower back, and burning when urinating.

## 2018-02-14 NOTE — Telephone Encounter (Signed)
Spoke with patient. Patient requesting OV with Dr. Sabra Heck. Patient states she went to her new PCP 1.5 wks ago for lower back pain, intermittent nausea, fatigue, dull pelvic cramping and burning with urination. Patient states UA was done, waited in exam room for 1 hr and left, not seen. MD called patient later that day to advise UA positive for blood, neg for infection, possible kidney stones, recommended CT scan. Patient states she has not heard from PCP for scheduling CT. Reports symptoms have not resolved or worsened. Denies fever/chills or vaginal bleeding.   Patient declined OV with covering provider on 2/5, OV scheduled for 2/6 at 1:30pm with Dr. Sabra Heck. ER/urgent care precautions provided for new or worsening symptoms. Advised I will review with Dr. Sabra Heck and return call if any additional recommendations. Patient agreeable.   Dr. Sabra Heck -please review.

## 2018-02-16 ENCOUNTER — Ambulatory Visit: Payer: 59 | Admitting: Obstetrics & Gynecology

## 2018-02-16 ENCOUNTER — Encounter: Payer: Self-pay | Admitting: Obstetrics & Gynecology

## 2018-02-16 ENCOUNTER — Other Ambulatory Visit: Payer: Self-pay

## 2018-02-16 VITALS — BP 128/88 | HR 96 | Resp 14 | Ht 64.75 in | Wt 174.0 lb

## 2018-02-16 DIAGNOSIS — R350 Frequency of micturition: Secondary | ICD-10-CM

## 2018-02-16 LAB — POCT URINALYSIS DIPSTICK
Bilirubin, UA: NEGATIVE
Glucose, UA: NEGATIVE
Ketones, UA: NEGATIVE
LEUKOCYTES UA: NEGATIVE
NITRITE UA: NEGATIVE
PH UA: 6 (ref 5.0–8.0)
PROTEIN UA: NEGATIVE
UROBILINOGEN UA: 0.2 U/dL

## 2018-02-16 MED ORDER — SULFAMETHOXAZOLE-TRIMETHOPRIM 800-160 MG PO TABS: 1.0000 | ORAL_TABLET | Freq: Two times a day (BID) | ORAL | 0 refills | Status: DC

## 2018-02-16 MED ORDER — FLUCONAZOLE 150 MG PO TABS: 150.0000 mg | ORAL_TABLET | Freq: Once | ORAL | 0 refills | Status: AC

## 2018-02-16 NOTE — Addendum Note (Signed)
Addended by: Polly Cobia on: 02/16/2018 02:17 PM   Modules accepted: Orders

## 2018-02-16 NOTE — Progress Notes (Signed)
GYNECOLOGY  VISIT  CC:   UTI symptoms, heart palpitations.   HPI: 48 y.o. G40P2002 Married Caucasian female here for low back pain, cramping, urinary frequency x 1 week that she says started the weekend of January 25th/26th.  She had increased urinary urgency and remembers being a movie and having to get up three time to go to the bathroom.  She did see a PCP and needed to leave due to having such a long wait.    Denies having any new sexual partners.  Married.  Denies fever.  She is having some low back pain.  She is also having some overall weakness.    Denies vaginal bleeding.    GYNECOLOGIC HISTORY: Patient's last menstrual period was 10/13/2009. Contraception: hysterectomy  Menopausal hormone therapy: none  Patient Active Problem List   Diagnosis Date Noted  . Fibromyalgia     Past Medical History:  Diagnosis Date  . Fibromyalgia 2/13  . 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 BREAST REDUCTION SURGERY  01/2015   . HYSTEROSCOPY  04/2006   D&C   . LAPAROSCOPIC TOTAL HYSTERECTOMY  11/10/09   TLH  . LIPOSUCTION  01/2015    full torso   . LIPOSUCTION  05/21/2016  . OVARIAN CYST REMOVAL  2008    MEDS:   Current Outpatient Medications on File Prior to Visit  Medication Sig Dispense Refill  . NON FORMULARY MMB daily     No current facility-administered medications on file prior to visit.     ALLERGIES: Azithromycin; Codeine; Ergocalciferol; and Synthroid [levothyroxine sodium]  Family History  Problem Relation Age of Onset  . Hypertension Father   . Thyroid disease Father   . Diabetes Father   . Diabetes Mother     SH:  Married, non smoker  Review of Systems  Cardiovascular: Positive for palpitations.  Genitourinary: Positive for frequency.  Musculoskeletal:       Muscle weakness   All other systems reviewed and are negative.   PHYSICAL EXAMINATION:    BP  128/88 (BP Location: Left Arm, Patient Position: Sitting, Cuff Size: Large)   Pulse 96   Resp 14   Ht 5' 4.75" (1.645 m)   Wt 174 lb (78.9 kg)   LMP 10/13/2009   BMI 29.18 kg/m     General appearance: alert, cooperative and appears stated age CV:  Regular rate and rhythm Abdomen: soft, non-tender; bowel sounds normal; no masses,  no organomegaly Lymph:  no inguinal LAD noted  Pelvic: External genitalia:  no lesions              Urethra:  normal appearing urethra with no masses, tenderness or lesions              Bartholins and Skenes: normal                 Vagina: normal appearing vagina with normal color and discharge, no lesions              Cervix: absent              Bimanual Exam:  Uterus:  uterus absent              Adnexa: no mass, fullness, tenderness  Chaperone was present for exam.  Assessment: Urinary urgency Microscopic hematuria  Plan: Urine micro and culture pending Bactrim DS bid x 5 days OTC azo recommended. Diflucan to pharmacy if  needed

## 2018-02-17 ENCOUNTER — Telehealth: Payer: Self-pay | Admitting: Obstetrics & Gynecology

## 2018-02-17 MED ORDER — NITROFURANTOIN MONOHYD MACRO 100 MG PO CAPS: 100.0000 mg | ORAL_CAPSULE | Freq: Two times a day (BID) | ORAL | 0 refills | Status: AC

## 2018-02-17 NOTE — Telephone Encounter (Signed)
Patient believes she is experiencing a reaction to Bactrim that she started yesterday. Patient stated that she is experiencing tingling in her fingers and "really bad itching." Call transferred to Glorianne Manchester, RN.

## 2018-02-17 NOTE — Telephone Encounter (Signed)
Spoke with patient, seen in office on 2/6 for UTI symptoms, tx with Bactrim DS. Took one dose of abx last night, reports itching all over her body, tingling and numbness in fingertips, and sore throat. Patient reports symptoms have resolved, she has not taken anymore Bactrim DS. Denies wheezing or SOB. Advised patient to discontinue medication, will review with Dr. Sabra Heck and return call with recommendations, patient agreeable.   Dr. Sabra Heck -please review and advise on alternative abx.

## 2018-02-17 NOTE — Telephone Encounter (Signed)
Ok to change to macrobid 174m bid x 5 days.  Please put bactrim in as an allergy.

## 2018-02-17 NOTE — Telephone Encounter (Signed)
Left detailed message, ok per dpr. Advised new Rx for macrobid 161m bid x5days sent to [facility] Allergies updated. Return call to office if any additional questions/concerns.   Encounter closed.

## 2018-02-18 LAB — URINE CULTURE: Organism ID, Bacteria: NO GROWTH

## 2018-02-18 LAB — URINALYSIS, MICROSCOPIC ONLY
Bacteria, UA: NONE SEEN
Casts: NONE SEEN /lpf
WBC, UA: NONE SEEN /hpf (ref 0–5)

## 2018-03-26 ENCOUNTER — Encounter (HOSPITAL_COMMUNITY): Payer: Self-pay

## 2018-03-26 ENCOUNTER — Other Ambulatory Visit: Payer: Self-pay

## 2018-03-26 ENCOUNTER — Emergency Department (HOSPITAL_COMMUNITY)
Admission: EM | Admit: 2018-03-26 | Discharge: 2018-03-26 | Disposition: A | Discharge status: Home / Self Care | Payer: 59 | Attending: Emergency Medicine | Admitting: Emergency Medicine

## 2018-03-26 DIAGNOSIS — R197 Diarrhea, unspecified: Secondary | ICD-10-CM

## 2018-03-26 DIAGNOSIS — R531 Weakness: Secondary | ICD-10-CM | POA: Insufficient documentation

## 2018-03-26 DIAGNOSIS — R112 Nausea with vomiting, unspecified: Secondary | ICD-10-CM | POA: Diagnosis present

## 2018-03-26 DIAGNOSIS — E860 Dehydration: Secondary | ICD-10-CM | POA: Diagnosis not present

## 2018-03-26 LAB — CBC
HCT: 47.4 % — ABNORMAL HIGH (ref 36.0–46.0)
Hemoglobin: 15.3 g/dL — ABNORMAL HIGH (ref 12.0–15.0)
MCH: 27.9 pg (ref 26.0–34.0)
MCHC: 32.3 g/dL (ref 30.0–36.0)
MCV: 86.3 fL (ref 80.0–100.0)
PLATELETS: 259 10*3/uL (ref 150–400)
RBC: 5.49 MIL/uL — ABNORMAL HIGH (ref 3.87–5.11)
RDW: 13.1 % (ref 11.5–15.5)
WBC: 13.8 10*3/uL — ABNORMAL HIGH (ref 4.0–10.5)
nRBC: 0 % (ref 0.0–0.2)

## 2018-03-26 LAB — COMPREHENSIVE METABOLIC PANEL
ALK PHOS: 55 U/L (ref 38–126)
ALT: 16 U/L (ref 0–44)
AST: 22 U/L (ref 15–41)
Albumin: 4.4 g/dL (ref 3.5–5.0)
Anion gap: 13 (ref 5–15)
BUN: 23 mg/dL — ABNORMAL HIGH (ref 6–20)
CALCIUM: 9.2 mg/dL (ref 8.9–10.3)
CO2: 20 mmol/L — AB (ref 22–32)
Chloride: 104 mmol/L (ref 98–111)
Creatinine, Ser: 0.81 mg/dL (ref 0.44–1.00)
GFR calc Af Amer: >60 mL/min (ref >60)
GFR calc non Af Amer: >60 mL/min (ref >60)
Glucose, Bld: 145 mg/dL — ABNORMAL HIGH (ref 70–99)
Potassium: 4.3 mmol/L (ref 3.5–5.1)
Sodium: 137 mmol/L (ref 135–145)
Total Bilirubin: 0.6 mg/dL (ref 0.3–1.2)
Total Protein: 7.9 g/dL (ref 6.5–8.1)

## 2018-03-26 LAB — URINALYSIS, ROUTINE W REFLEX MICROSCOPIC
Bacteria, UA: NONE SEEN
Bilirubin Urine: NEGATIVE
Glucose, UA: NEGATIVE mg/dL
Hgb urine dipstick: NEGATIVE
Ketones, ur: NEGATIVE mg/dL
Nitrite: NEGATIVE
Protein, ur: NEGATIVE mg/dL
Specific Gravity, Urine: 1.019 (ref 1.005–1.030)
pH: 6 (ref 5.0–8.0)

## 2018-03-26 LAB — LIPASE, BLOOD: Lipase: 27 U/L (ref 11–51)

## 2018-03-26 MED ORDER — SODIUM CHLORIDE 0.9 % IV BOLUS
1000.0000 mL | Freq: Once | INTRAVENOUS | Status: AC
  Administered 2018-03-26: 1000 mL via INTRAVENOUS

## 2018-03-26 MED ORDER — ONDANSETRON HCL 4 MG/2ML IJ SOLN
4.0000 mg | Freq: Once | INTRAMUSCULAR | Status: AC
  Administered 2018-03-26: 4 mg via INTRAVENOUS
  Filled 2018-03-26: qty 2

## 2018-03-26 MED ORDER — MORPHINE SULFATE (PF) 4 MG/ML IV SOLN: 4.0000 mg | Freq: Once | INTRAVENOUS | Status: DC

## 2018-03-26 MED ORDER — ONDANSETRON HCL 4 MG PO TABS: 4.0000 mg | ORAL_TABLET | Freq: Three times a day (TID) | ORAL | 0 refills | Status: DC | PRN

## 2018-03-26 NOTE — ED Notes (Signed)
Pt states she would let us known when she was able to provide a urine sample. States she feels too dehydrated to urinate at this time.

## 2018-03-26 NOTE — Discharge Instructions (Signed)
You were seen in the emergency department for acute onset of nausea vomiting and diarrhea.  Your lab work was fairly normal and your symptoms were improved with some nausea medication and fluids.  You can continue to use the Zofran every 6-8 hours as needed.  You can use Imodium for diarrhea.  Please try to keep well-hydrated and advance her diet as tolerated.  Follow-up with your doctor return if any worsening symptoms.

## 2018-03-26 NOTE — ED Notes (Signed)
Pt was given fluid to PO challenge.

## 2018-03-26 NOTE — ED Triage Notes (Addendum)
Patient arrived via POV with Husband at bedside. Patient is AOx4 and ambulatory however patient is extremely weak so wheelchair was used for patient. Patient chief complaint is N/V/D and severe weakness with patient stating she feels like she is going to pass out which started this morning around 0600. Patient has had numerous episodes of N/V/D.

## 2018-03-26 NOTE — ED Provider Notes (Signed)
[place] DEPT Provider Note   CSN: [id number] Arrival date & time: 03/26/18  1000    History   Chief Complaint Chief Complaint  Patient presents with  . N/V/D  . Weakness    HPI Jade Daniels is a 48 y.o. female.  48 year old female here with acute onset of nausea vomiting diarrhea that started at 6 AM today.  She says it is coming out of both hands at the same time.  She feels generally weak and chilled.  No known fever.  No recent travel.  She ate a group meal at somebody's home with spaghetti and salad last evening and her husband had the same thing and is not feeling sick.  She is tried nothing for it.  She has had a partial hysterectomy.  No recent antibiotics or C. difficile risk factors.     The history is provided by the patient.  Emesis  Severity:  Severe Duration:  5 hours Timing:  Constant Quality:  Stomach contents Progression:  Unchanged Chronicity:  New Recent urination:  Normal Context: not post-tussive and not self-induced   Relieved by:  None tried Worsened by:  Nothing Ineffective treatments:  None tried Associated symptoms: abdominal pain, chills and diarrhea   Associated symptoms: no cough, no fever, no headaches, no myalgias, no sore throat and no URI     Past Medical History:  Diagnosis Date  . Fibromyalgia 2/13  . Methylenetetrahydrofolate reductase (MTHFR) gene mutation [facility])    heterozygous    Patient Active Problem List   Diagnosis Date Noted  . Fibromyalgia     Past Surgical History:  Procedure Laterality Date  . BREAST REDUCTION SURGERY  01/2015   . HYSTEROSCOPY  04/2006   D&C   . LAPAROSCOPIC TOTAL HYSTERECTOMY  11/10/09   TLH  . LIPOSUCTION  01/2015    full torso   . LIPOSUCTION  05/21/2016  . OVARIAN CYST REMOVAL  2008     OB History    Gravida  2   Para  2   Term  2   Preterm      AB      Living  2     SAB      TAB      Ectopic      Multiple      Live Births              Home Medications    Prior to Admission medications   Medication Sig Start Date End Date Taking? Authorizing Provider  NON FORMULARY MMB daily    [provider]    Family History Family History  Problem Relation Age of Onset  . Hypertension Father   . Thyroid disease Father   . Diabetes Father   . Diabetes Mother     Social History Social History   Tobacco Use  . Smoking status: Never Smoker  . Smokeless tobacco: Never Used  Substance Use Topics  . Alcohol use: Yes    Alcohol/week: 1.0 standard drinks    Types: 1 Glasses of wine per week    Comment: wine once/twice month  . Drug use: No     Allergies   Azithromycin; Bactrim [sulfamethoxazole-trimethoprim]; Codeine; Ergocalciferol; and Synthroid [levothyroxine sodium]   Review of Systems Review of Systems  Constitutional: Positive for chills. Negative for fever.  HENT: Negative for sore throat.   Eyes: Negative for visual disturbance.  Respiratory: Negative for cough and shortness of breath.   Cardiovascular: Negative for  chest pain.  Gastrointestinal: Positive for abdominal pain, diarrhea and vomiting.  Genitourinary: Negative for dysuria.  Musculoskeletal: Negative for myalgias.  Skin: Negative for rash.  Neurological: Negative for headaches.     Physical Exam Updated Vital Signs BP 108/62 (BP Location: Left Arm)   Pulse 73   Temp (!) 97.5 F (36.4 C) (Oral)   Resp 20   Ht 5' 4"  (1.626 m)   Wt 77.6 kg   LMP 10/13/2009   SpO2 100%   BMI 29.35 kg/m   Physical Exam Vitals signs and nursing note reviewed.  Constitutional:      General: She is not in acute distress.    Appearance: She is well-developed.  HENT:     Head: Normocephalic and atraumatic.  Eyes:     Conjunctiva/sclera: Conjunctivae normal.  Neck:     Musculoskeletal: Neck supple.  Cardiovascular:     Rate and Rhythm: Normal rate and regular rhythm.     Heart sounds: No murmur.  Pulmonary:     Effort: Pulmonary  effort is normal. No respiratory distress.     Breath sounds: Normal breath sounds.  Abdominal:     Palpations: Abdomen is soft.     Tenderness: There is no abdominal tenderness. There is no guarding or rebound.  Musculoskeletal: Normal range of motion.     Right lower leg: No edema.     Left lower leg: No edema.  Skin:    General: Skin is warm and dry.     Capillary Refill: Capillary refill takes less than 2 seconds.  Neurological:     General: No focal deficit present.     Mental Status: She is alert and oriented to person, place, and time.     Motor: Weakness (generalized) present.      ED Treatments / Results  Labs (all labs ordered are listed, but only abnormal results are displayed) Labs Reviewed  COMPREHENSIVE METABOLIC PANEL - Abnormal; Notable for the following components:      Result Value   CO2 20 (*)    Glucose, Bld 145 (*)    BUN 23 (*)    All other components within normal limits  CBC - Abnormal; Notable for the following components:   WBC 13.8 (*)    RBC 5.49 (*)    Hemoglobin 15.3 (*)    HCT 47.4 (*)    All other components within normal limits  URINALYSIS, ROUTINE W REFLEX MICROSCOPIC - Abnormal; Notable for the following components:   Leukocytes,Ua TRACE (*)    All other components within normal limits  URINE CULTURE  LIPASE, BLOOD    EKG EKG Interpretation  Date/Time:  Sunday March 26 2018 10:20:38 EDT Ventricular Rate:  76 PR Interval:    QRS Duration: 79 QT Interval:  389 QTC Calculation: 438 R Axis:   55 Text Interpretation:  Sinus rhythm no acute st/ts No old tracing to compare Confirmed by Aletta Edouard [phone]) on 03/26/2018 10:27:54 AM   Radiology No results found.  Procedures Procedures (including critical care time)  Medications Ordered in ED Medications  sodium chloride 0.9 % bolus 1,000 mL (0 mLs Intravenous Stopped 03/26/18 1246)  ondansetron (ZOFRAN) injection 4 mg (4 mg Intravenous Given 03/26/18 1101)  sodium chloride  0.9 % bolus 1,000 mL (0 mLs Intravenous Stopped 03/26/18 1547)     Initial Impression / Assessment and Plan / ED Course  I have reviewed the triage vital signs and the nursing notes.  Pertinent labs & imaging results that were available  during my care of the patient were reviewed by me and considered in my medical decision making (see chart for details).  Clinical Course as of Mar 26 636  Sun Mar 26, 6014  2063 48 year old female with acute onset of nausea vomiting diarrhea.  No sick contacts no recent travel.  Patient feels generally weak.  Afebrile here initial WBC is slightly elevated.  Electrolytes okay CO2 at 20 so likely a little clamped down.  She is receiving some IV fluids and nausea medication looking better already.  Will give her some more fluids and p.o. trial.   [MB]  1513 Patient feeling better and is tolerating p.o.  She is comfortable going home and understands to follow-up with her doctor return if any worsening symptoms.   [MB]    Clinical Course User Index [MB] Hayden Rasmussen, MD        Final Clinical Impressions(s) / ED Diagnoses   Final diagnoses:  Nausea vomiting and diarrhea  Dehydration    ED Discharge Orders         Ordered    ondansetron (ZOFRAN) 4 MG tablet  Every 8 hours PRN     03/26/18 1515           Hayden Rasmussen, MD 03/27/18 [phone]

## 2018-03-27 LAB — URINE CULTURE: Culture: NO GROWTH

## 2018-05-26 ENCOUNTER — Emergency Department (HOSPITAL_COMMUNITY)
Admission: EM | Admit: 2018-05-26 | Discharge: 2018-05-26 | Disposition: A | Discharge status: Home / Self Care | Payer: 59 | Attending: Emergency Medicine | Admitting: Emergency Medicine

## 2018-05-26 ENCOUNTER — Encounter (HOSPITAL_COMMUNITY): Payer: Self-pay

## 2018-05-26 ENCOUNTER — Emergency Department (HOSPITAL_COMMUNITY): Payer: 59

## 2018-05-26 ENCOUNTER — Other Ambulatory Visit: Payer: Self-pay

## 2018-05-26 DIAGNOSIS — R071 Chest pain on breathing: Secondary | ICD-10-CM | POA: Insufficient documentation

## 2018-05-26 DIAGNOSIS — Z79899 Other long term (current) drug therapy: Secondary | ICD-10-CM | POA: Diagnosis not present

## 2018-05-26 DIAGNOSIS — R0789 Other chest pain: Secondary | ICD-10-CM | POA: Insufficient documentation

## 2018-05-26 DIAGNOSIS — R1909 Other intra-abdominal and pelvic swelling, mass and lump: Secondary | ICD-10-CM | POA: Diagnosis not present

## 2018-05-26 LAB — URINALYSIS, ROUTINE W REFLEX MICROSCOPIC
Bilirubin Urine: NEGATIVE
Glucose, UA: NEGATIVE mg/dL
Ketones, ur: NEGATIVE mg/dL
Leukocytes,Ua: NEGATIVE
Nitrite: NEGATIVE
Protein, ur: NEGATIVE mg/dL
Specific Gravity, Urine: 1.023 (ref 1.005–1.030)
pH: 6 (ref 5.0–8.0)

## 2018-05-26 LAB — BASIC METABOLIC PANEL
Anion gap: 9 (ref 5–15)
BUN: 19 mg/dL (ref 6–20)
CO2: 24 mmol/L (ref 22–32)
Calcium: 9 mg/dL (ref 8.9–10.3)
Chloride: 102 mmol/L (ref 98–111)
Creatinine, Ser: 0.9 mg/dL (ref 0.44–1.00)
GFR calc Af Amer: >60 mL/min (ref >60)
GFR calc non Af Amer: >60 mL/min (ref >60)
Glucose, Bld: 103 mg/dL — ABNORMAL HIGH (ref 70–99)
Potassium: 4.1 mmol/L (ref 3.5–5.1)
Sodium: 135 mmol/L (ref 135–145)

## 2018-05-26 LAB — CBC
HCT: 41.2 % (ref 36.0–46.0)
Hemoglobin: 13.9 g/dL (ref 12.0–15.0)
MCH: 28.5 pg (ref 26.0–34.0)
MCHC: 33.7 g/dL (ref 30.0–36.0)
MCV: 84.6 fL (ref 80.0–100.0)
Platelets: 266 10*3/uL (ref 150–400)
RBC: 4.87 MIL/uL (ref 3.87–5.11)
RDW: 13.1 % (ref 11.5–15.5)
WBC: 7.6 10*3/uL (ref 4.0–10.5)
nRBC: 0 % (ref 0.0–0.2)

## 2018-05-26 LAB — D-DIMER, QUANTITATIVE: D-Dimer, Quant: 0.42 ug/mL-FEU (ref 0.00–0.50)

## 2018-05-26 MED ORDER — KETOROLAC TROMETHAMINE 30 MG/ML IJ SOLN
30.0000 mg | Freq: Once | INTRAMUSCULAR | Status: AC
  Administered 2018-05-26: 09:00:00 30 mg via INTRAVENOUS
  Filled 2018-05-26: qty 1

## 2018-05-26 MED ORDER — NAPROXEN 500 MG PO TABS: 500.0000 mg | ORAL_TABLET | Freq: Two times a day (BID) | ORAL | 0 refills | Status: DC

## 2018-05-26 MED ORDER — CYCLOBENZAPRINE HCL 10 MG PO TABS: 10.0000 mg | ORAL_TABLET | Freq: Two times a day (BID) | ORAL | 0 refills | Status: DC | PRN

## 2018-05-26 NOTE — Discharge Instructions (Addendum)
Take the medications as needed for pain and discomfort, follow-up with your primary care doctor to be rechecked, return to the emergency room for fever, shortness of breath or other worsening symptoms

## 2018-05-26 NOTE — ED Triage Notes (Signed)
Patient has small "knot" to left lower rib cage. Patient reports pain radiates around the left lower rib cage, is reproducable with touch, and makes a deep breath, sleeping, or moving painful. Patient denies cough, SOB, or non-reproduceable chest pain.

## 2018-05-26 NOTE — ED Provider Notes (Signed)
[facility] DEPT Provider Note   CSN: [id number] Arrival date & time: 05/26/18  0720    History   Chief Complaint Chief Complaint  Patient presents with  . Chest Pain    HPI Jade Daniels is a 48 y.o. female.     HPI Patient presents to the emergency room for evaluation of left-sided chest pain.  Patient states she noticed a sharp type discomfort in the left lower rib cage.  Symptoms started about a week or pain.  Pain has been gradually getting more severe.  Pain is now moving towards her left flank, back.  Last evening pain did not relent and she was unable to get comfortable.  It has been tender to the touch.  Positions and deep breathing increase the pain.  She has not felt short of breath.  She denies any abdominal pain.  She denies any nausea vomiting.  No dysuria.  No leg swelling.  No history of similar symptoms.  She has not tried anything for the pain. Past Medical History:  Diagnosis Date  . Fibromyalgia 2/13  . Methylenetetrahydrofolate reductase (MTHFR) gene mutation [facility])    heterozygous    Patient Active Problem List   Diagnosis Date Noted  . Fibromyalgia     Past Surgical History:  Procedure Laterality Date  . BREAST REDUCTION SURGERY  01/2015   . HYSTEROSCOPY  04/2006   D&C   . LAPAROSCOPIC TOTAL HYSTERECTOMY  11/10/09   TLH  . LIPOSUCTION  01/2015    full torso   . LIPOSUCTION  05/21/2016  . OVARIAN CYST REMOVAL  2008     OB History    Gravida  2   Para  2   Term  2   Preterm      AB      Living  2     SAB      TAB      Ectopic      Multiple      Live Births               Home Medications    Prior to Admission medications   Medication Sig Start Date End Date Taking? Authorizing Provider  BIOTIN PO Take 1 capsule by mouth daily.   Yes [provider]  cetirizine (ZYRTEC) 10 MG tablet Take 10 mg by mouth daily.   Yes [provider]  Cyanocobalamin (VITAMIN B12 PO) Take 1  tablet by mouth daily.   Yes [provider]  cyclobenzaprine (FLEXERIL) 10 MG tablet Take 1 tablet (10 mg total) by mouth 2 (two) times daily as needed for muscle spasms. 05/26/18   Dorie Rank, MD  naproxen (NAPROSYN) 500 MG tablet Take 1 tablet (500 mg total) by mouth 2 (two) times daily. 05/26/18   Dorie Rank, MD  ondansetron (ZOFRAN) 4 MG tablet Take 1 tablet (4 mg total) by mouth every 8 (eight) hours as needed for nausea or vomiting. Patient not taking: Reported on 05/26/2018 03/26/18   Hayden Rasmussen, MD    Family History Family History  Problem Relation Age of Onset  . Hypertension Father   . Thyroid disease Father   . Diabetes Father   . Diabetes Mother     Social History Social History   Tobacco Use  . Smoking status: Never Smoker  . Smokeless tobacco: Never Used  Substance Use Topics  . Alcohol use: Yes    Alcohol/week: 1.0 standard drinks    Types: 1 Glasses of  wine per week    Comment: wine once/twice month  . Drug use: No     Allergies   Azithromycin; Bactrim [sulfamethoxazole-trimethoprim]; Codeine; Ergocalciferol; and Synthroid [levothyroxine sodium]   Review of Systems Review of Systems  All other systems reviewed and are negative.    Physical Exam Updated Vital Signs BP 121/77   Pulse 80   Temp 98.7 F (37.1 C) (Oral)   Resp 12   Ht 1.676 m (5' 6" )   Wt 77.6 kg   LMP 10/13/2009   SpO2 97%   BMI 27.61 kg/m   Physical Exam Vitals signs and nursing note reviewed.  Constitutional:      General: She is not in acute distress.    Appearance: She is well-developed.  HENT:     Head: Normocephalic and atraumatic.     Right Ear: External ear normal.     Left Ear: External ear normal.  Eyes:     General: No scleral icterus.       Right eye: No discharge.        Left eye: No discharge.     Conjunctiva/sclera: Conjunctivae normal.  Neck:     Musculoskeletal: Neck supple.     Trachea: No tracheal deviation.  Cardiovascular:     Rate  and Rhythm: Normal rate and regular rhythm.  Pulmonary:     Effort: Pulmonary effort is normal. No respiratory distress.     Breath sounds: Normal breath sounds. No stridor. No wheezing or rales.  Chest:     Chest wall: Tenderness present.     Comments: Tenderness palpation left anterior chest wall, reproducible area of her pain, no edema or mass appreciated.  No crepitus. Abdominal:     General: Bowel sounds are normal. There is no distension.     Palpations: Abdomen is soft.     Tenderness: There is no abdominal tenderness. There is no guarding or rebound.  Musculoskeletal:        General: No tenderness.  Skin:    General: Skin is warm and dry.     Findings: No rash.  Neurological:     Mental Status: She is alert.     Cranial Nerves: No cranial nerve deficit (no facial droop, extraocular movements intact, no slurred speech).     Sensory: No sensory deficit.     Motor: No abnormal muscle tone or seizure activity.     Coordination: Coordination normal.      ED Treatments / Results  Labs (all labs ordered are listed, but only abnormal results are displayed) Labs Reviewed  BASIC METABOLIC PANEL - Abnormal; Notable for the following components:      Result Value   Glucose, Bld 103 (*)    All other components within normal limits  URINALYSIS, ROUTINE W REFLEX MICROSCOPIC - Abnormal; Notable for the following components:   APPearance HAZY (*)    Hgb urine dipstick MODERATE (*)    Bacteria, UA MANY (*)    All other components within normal limits  CBC  D-DIMER, QUANTITATIVE (NOT AT [facility])    EKG EKG Interpretation  Date/Time:  Friday May 26 2018 08:01:50 EDT Ventricular Rate:  77 PR Interval:    QRS Duration: 80 QT Interval:  369 QTC Calculation: 418 R Axis:   69 Text Interpretation:  Sinus rhythm No significant change since last tracing Confirmed by Dorie Rank [phone]) on 05/26/2018 8:17:41 AM   Radiology Dg Chest Portable 1 View  Result Date: 05/26/2018 CLINICAL  DATA:  Chest pain EXAM:  PORTABLE CHEST 1 VIEW COMPARISON:  02/04/2016 FINDINGS: The heart size and mediastinal contours are within normal limits. Both lungs are clear. The visualized skeletal structures are unremarkable. IMPRESSION: No active disease. Electronically Signed   By: Jerilynn Mages.  Shick M.D.   On: 05/26/2018 08:25   Ct Renal Stone Study  Result Date: 05/26/2018 CLINICAL DATA:  Left-sided pain.  Lump.  Question kidney stone. EXAM: CT ABDOMEN AND PELVIS WITHOUT CONTRAST TECHNIQUE: Multidetector CT imaging of the abdomen and pelvis was performed following the standard protocol without IV contrast. COMPARISON:  None. FINDINGS: Lower chest: Normal Hepatobiliary: Liver parenchyma is normal.  No calcified gallstones. Pancreas: Normal Spleen: Normal Adrenals/Urinary Tract: Adrenal glands are normal. Kidneys are normal. No cyst, mass, or hydronephrosis. Bladder is normal. Stomach/Bowel: No visible bowel pathology. Vascular/Lymphatic: Normal Reproductive: Previous hysterectomy.  No pelvic mass. Other: No free fluid or air. Musculoskeletal: Normal. Clinical history reports some sort of lump at the left lower ribcage. Abnormality is visible by CT. IMPRESSION: Negative study. No evidence of urinary tract stone disease or other pathology. No other significant finding. Previous hysterectomy. Electronically Signed   By: Nelson Chimes M.D.   On: 05/26/2018 10:56    Procedures Procedures (including critical care time)  Medications Ordered in ED Medications  ketorolac (TORADOL) 30 MG/ML injection 30 mg (30 mg Intravenous Given 05/26/18 0844)     Initial Impression / Assessment and Plan / ED Course  I have reviewed the triage vital signs and the nursing notes.  Pertinent labs & imaging results that were available during my care of the patient were reviewed by me and considered in my medical decision making (see chart for details).  Clinical Course as of May 25 1213  Fri May 26, 2018  0943 Labs and x-rays  reviewed.  D-dimer negative.  Doubt PE.  Urinalysis shows bacteria and 6-10 red blood cells.  Will CT to evaluate for possible kidney stone   [JK]    Clinical Course User Index [JK] Dorie Rank, MD     Patient presented with complaints of left-sided chest wall pain.  On exam she had a focal area of tenderness.  No erythema or mass palpated.  ED work-up is reassuring.  No signs of pulmonary embolism.  Symptoms not consistent with cardiac etiology.  EKG reassuring.  Amount of blood noted in the urine but CT scan of abdomen pelvis does not show any acute abnormality.  Suspect her symptoms may be musculoskeletal in nature.  Plan on discharge home with prescription for NSAIDs and muscle relaxants.  Warning signs and precautions discussed.  Final Clinical Impressions(s) / ED Diagnoses   Final diagnoses:  Chest wall pain    ED Discharge Orders         Ordered    naproxen (NAPROSYN) 500 MG tablet  2 times daily     05/26/18 1214    cyclobenzaprine (FLEXERIL) 10 MG tablet  2 times daily PRN     05/26/18 1214           Dorie Rank, MD 05/26/18 1215

## 2018-09-04 ENCOUNTER — Encounter: Payer: Self-pay | Admitting: Obstetrics & Gynecology

## 2018-11-03 ENCOUNTER — Encounter: Payer: Self-pay | Admitting: Cardiology

## 2018-11-03 ENCOUNTER — Other Ambulatory Visit: Payer: Self-pay

## 2018-11-03 ENCOUNTER — Ambulatory Visit: Payer: 59 | Admitting: Cardiology

## 2018-11-03 VITALS — BP 127/71 | HR 79 | Temp 97.3°F | Ht 64.0 in | Wt 174.7 lb

## 2018-11-03 DIAGNOSIS — R0989 Other specified symptoms and signs involving the circulatory and respiratory systems: Secondary | ICD-10-CM

## 2018-11-03 DIAGNOSIS — R0600 Dyspnea, unspecified: Secondary | ICD-10-CM | POA: Diagnosis not present

## 2018-11-03 DIAGNOSIS — I209 Angina pectoris, unspecified: Secondary | ICD-10-CM | POA: Diagnosis not present

## 2018-11-03 DIAGNOSIS — R0609 Other forms of dyspnea: Secondary | ICD-10-CM

## 2018-11-03 DIAGNOSIS — E785 Hyperlipidemia, unspecified: Secondary | ICD-10-CM | POA: Diagnosis not present

## 2018-11-03 MED ORDER — ASPIRIN EC 81 MG PO TBEC: 81.0000 mg | DELAYED_RELEASE_TABLET | Freq: Every day | ORAL | 3 refills | Status: DC

## 2018-11-03 MED ORDER — METOPROLOL SUCCINATE ER 25 MG PO TB24: 25.0000 mg | ORAL_TABLET | Freq: Every day | ORAL | 1 refills | Status: DC

## 2018-11-03 NOTE — Progress Notes (Signed)
Primary Physician:  Merrilee Seashore, MD   Patient ID: Jade Daniels, female    DOB: 10-23-1970, 48 y.o.   MRN: [id number]  Subjective:    Chief Complaint  Patient presents with  . Chest Pain  . Shortness of Breath  . New Patient (Initial Visit)    fm hx of cad    HPI: Jade Daniels  is a 48 y.o. female  with fibromyalgia, family history of CAD, referred to [place] for evaluation of chest pain and dyspnea on exertion.   She had an episode a few weeks ago, where she had chest pain that radiated to her shoulder and jaw. Lasting approximately 20 minutes and resolved with stepping away from her desk and getting fresh air. Was not affected by deep breathing. She had previously had a similar episode a few years ago, that was evaluated in the ER that she reports was unyielding. Both episodes occurred with stress. She has also noticed getting winded with doing routine activities that she states is out of proportion for her activity.    She has had a few elevated blood pressure readings, then other times is normal. She reports last year her cholesterol was checked with her OBGYN and was noted to be elevated, but after making diet changes, significantly improved.   She had previously exercised at the [facility]. She has been biking over the last few months in view of COVID. She does not smoke.   Past Medical History:  Diagnosis Date  . Fibromyalgia 2/13  . 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 BREAST REDUCTION SURGERY  01/2015   . eyelid surgery    . HYSTEROSCOPY  04/2006   D&C   . LAPAROSCOPIC TOTAL HYSTERECTOMY  11/10/09   TLH  . LIPOSUCTION  01/2015    full torso   . LIPOSUCTION  05/21/2016  . OVARIAN CYST REMOVAL  2008    Social History   Socioeconomic History  . Marital status: Married    Spouse name: Not on file  . Number of children: 2  . Years of education: Not on file  .  Highest education level: Not on file  Occupational History  . Not on file  Social Needs  . Financial resource strain: Not on file  . Food insecurity    Worry: Not on file    Inability: Not on file  . Transportation needs    Medical: Not on file    Non-medical: Not on file  Tobacco Use  . Smoking status: Never Smoker  . Smokeless tobacco: Never Used  Substance and Sexual Activity  . Alcohol use: Yes    Alcohol/week: 1.0 standard drinks    Types: 1 Glasses of wine per week    Comment: wine once/twice month  . Drug use: No  . Sexual activity: Yes    Partners: Male    Birth control/protection: Surgical    Comment: TLH, husband with vasectomy  Lifestyle  . Physical activity    Days per week: Not on file    Minutes per session: Not on file  . Stress: Not on file  Relationships  . Social Herbalist on phone: Not on file    Gets together: Not on file    Attends religious service: Not on file    Active member of club or organization: Not on file    Attends meetings of clubs or organizations:  Not on file    Relationship status: Not on file  . Intimate partner violence    Fear of current or ex partner: Not on file    Emotionally abused: Not on file    Physically abused: Not on file    Forced sexual activity: Not on file  Other Topics Concern  . Not on file  Social History Narrative  . Not on file    Review of Systems  Constitution: Negative for decreased appetite, malaise/fatigue, weight gain and weight loss.  Eyes: Negative for visual disturbance.  Cardiovascular: Positive for chest pain and dyspnea on exertion. Negative for claudication, leg swelling, orthopnea, palpitations and syncope.  Respiratory: Negative for hemoptysis and wheezing.   Endocrine: Negative for cold intolerance and heat intolerance.  Hematologic/Lymphatic: Does not bruise/bleed easily.  Skin: Negative for nail changes.  Musculoskeletal: Negative for muscle weakness and myalgias.   Gastrointestinal: Negative for abdominal pain, change in bowel habit, nausea and vomiting.  Neurological: Negative for difficulty with concentration, dizziness, focal weakness and headaches.  Psychiatric/Behavioral: Negative for altered mental status and suicidal ideas.  All other systems reviewed and are negative.     Objective:  Blood pressure 127/71, pulse 79, temperature (!) 97.3 F (36.3 C), height _0  (1.626 m), weight 174 lb 11.2 oz (79.2 kg), last menstrual period 10/13/2009, SpO2 98 %. Body mass index is 29.99 kg/m.    Physical Exam  Constitutional: She is oriented to person, place, and time. Vital signs are normal. She appears well-developed and well-nourished.  HENT:  Head: Normocephalic and atraumatic.  Neck: Normal range of motion.  Cardiovascular: Normal rate, regular rhythm, normal heart sounds and intact distal pulses.  Pulmonary/Chest: Effort normal and breath sounds normal. No accessory muscle usage. No respiratory distress.  Abdominal: Soft. Bowel sounds are normal.  Musculoskeletal: Normal range of motion.  Neurological: She is alert and oriented to person, place, and time.  Skin: Skin is warm and dry.  Vitals reviewed.  Radiology: No results found.  Laboratory examination:   07/01/2018: Creatinine 0.8, EGFR 78, potassium 4.2, glucose 97, CMP otherwise normal.  Thyroid panel normal.  CBC normal. CMP Latest Ref Rng & Units 05/26/2018 03/26/2018 08/08/2017  Glucose 70 - 99 mg/dL 103(H) 145(H) 103(H)  BUN 6 - 20 mg/dL 19 23(H) 13  Creatinine 0.44 - 1.00 mg/dL 0.90 0.81 0.86  Sodium 135 - 145 mmol/L 135 137 139  Potassium 3.5 - 5.1 mmol/L 4.1 4.3 4.7  Chloride 98 - 111 mmol/L 102 104 101  CO2 22 - 32 mmol/L 24 20(L) 24  Calcium 8.9 - 10.3 mg/dL 9.0 9.2 9.2  Total Protein 6.5 - 8.1 g/dL - 7.9 7.2  Total Bilirubin 0.3 - 1.2 mg/dL - 0.6 0.4  Alkaline Phos 38 - 126 U/L - 55 53  AST 15 - 41 U/L - 22 11  ALT 0 - 44 U/L - 16 11   CBC Latest Ref Rng & Units  05/26/2018 03/26/2018 03/24/2017  WBC 4.0 - 10.5 K/uL 7.6 13.8(H) 8.2  Hemoglobin 12.0 - 15.0 g/dL 13.9 15.3(H) 13.4  Hematocrit 36.0 - 46.0 % 41.2 47.4(H) 40.1  Platelets 150 - 400 K/uL 266 259 329   Lipid Panel     Component Value Date/Time   CHOL 196 11/17/2017 0831   TRIG 192 (H) 11/17/2017 0831   HDL 43 11/17/2017 0831   CHOLHDL 4.6 (H) 11/17/2017 0831   LDLCALC 115 (H) 11/17/2017 0831   HEMOGLOBIN A1C Lab Results  Component Value Date   HGBA1C  5.5 11/17/2017   TSH No results for input(s): TSH in the last 8760 hours.  PRN Meds:. Medications Discontinued During This Encounter  Medication Reason  . Cyanocobalamin (VITAMIN B12 PO) Error  . cyclobenzaprine (FLEXERIL) 10 MG tablet Error  . naproxen (NAPROSYN) 500 MG tablet Error  . ondansetron (ZOFRAN) 4 MG tablet Error   Current Meds  Medication Sig  . BIOTIN PO Take 1 capsule by mouth daily.  . cetirizine (ZYRTEC) 10 MG tablet Take 10 mg by mouth daily.  . Collagen Hydrolysate POWD by Does not apply route.    Cardiac Studies:   Echo 10/19/2018: Normal LVEF at 60 to 65%.  Mild LVH.  No wall motion abnormalities.  Diastolic dysfunction.  Trace MR.  Mild TR.  Assessment:   Angina pectoris ([place]) - Plan: EKG 12-Lead, CT CORONARY MORPH W/CTA COR W/SCORE W/CA W/CM &/OR WO/CM, CT COROARY FRACTIONAL FLOW RESERVE (FFR), CT CORONARY FRACTIONAL FLOW RESERVE FLUID ANALYSIS  Labile hypertension  Dyspnea on exertion  Mild hyperlipidemia  EKG 11/03/2018: Normal sinus rhythm at 72 bpm, normal axis, no evidence of ischemia.  Recommendations:   She has recently had an episode associated with stress that is concerning for angina.  She is also developing dyspnea on exertion for the last few months.  Risk factors for CAD include family history, mild hyperlipidemia, and appears to be developing some labile hypertension.  We will further evaluate with coronary CTA.  I have started her on metoprolol succinate 25 mg daily for antianginal  therapy and also for labile hypertension.  Encouraged her to start daily 81 mg aspirin until she can undergo further cardiac testing.  I reviewed echocardiogram from PCP office, she does have mild LVH and findings suggestive of diastolic dysfunction, which correlate with her labile hypertension.  Discussed avoiding salt in her diet to help with controlling this.  She had lipids performed approximately 1 year ago, initially were elevated, with making diet changes and repeat blood work, lipids had improved, but still mildly elevated.  She is to have her lipids performed in the next few weeks and will reevaluate at that time.  Also depending upon her CT scan findings will decide if she needs statin therapy at that point.  She wishes to hold off on starting statin today.  I will see her back after her CTA for follow-up.   *I have discussed this case with Dr. Einar Gip and he personally examined the patient and participated in formulating the plan.*   Miquel Dunn, MSN, APRN, FNP-C [facility]. [place] Office: [phone] Fax: [phone]

## 2018-11-04 ENCOUNTER — Emergency Department (HOSPITAL_COMMUNITY): Payer: 59

## 2018-11-04 ENCOUNTER — Emergency Department (HOSPITAL_COMMUNITY)
Admission: EM | Admit: 2018-11-04 | Discharge: 2018-11-04 | Disposition: A | Discharge status: Home / Self Care | Payer: 59 | Attending: Emergency Medicine | Admitting: Emergency Medicine

## 2018-11-04 ENCOUNTER — Encounter (HOSPITAL_COMMUNITY): Payer: Self-pay

## 2018-11-04 ENCOUNTER — Other Ambulatory Visit: Payer: Self-pay

## 2018-11-04 DIAGNOSIS — Z79899 Other long term (current) drug therapy: Secondary | ICD-10-CM | POA: Insufficient documentation

## 2018-11-04 DIAGNOSIS — I10 Essential (primary) hypertension: Secondary | ICD-10-CM | POA: Insufficient documentation

## 2018-11-04 DIAGNOSIS — Z7982 Long term (current) use of aspirin: Secondary | ICD-10-CM | POA: Insufficient documentation

## 2018-11-04 DIAGNOSIS — R079 Chest pain, unspecified: Secondary | ICD-10-CM

## 2018-11-04 LAB — CBC
HCT: 41.4 % (ref 36.0–46.0)
Hemoglobin: 13.5 g/dL (ref 12.0–15.0)
MCH: 27.6 pg (ref 26.0–34.0)
MCHC: 32.6 g/dL (ref 30.0–36.0)
MCV: 84.5 fL (ref 80.0–100.0)
Platelets: 307 10*3/uL (ref 150–400)
RBC: 4.9 MIL/uL (ref 3.87–5.11)
RDW: 12.7 % (ref 11.5–15.5)
WBC: 7.6 10*3/uL (ref 4.0–10.5)
nRBC: 0 % (ref 0.0–0.2)

## 2018-11-04 LAB — I-STAT BETA HCG BLOOD, ED (MC, WL, AP ONLY): I-stat hCG, quantitative: <5 m[IU]/mL (ref <5)

## 2018-11-04 LAB — URINALYSIS, ROUTINE W REFLEX MICROSCOPIC
Bilirubin Urine: NEGATIVE
Glucose, UA: NEGATIVE mg/dL
Ketones, ur: NEGATIVE mg/dL
Leukocytes,Ua: NEGATIVE
Nitrite: NEGATIVE
Protein, ur: NEGATIVE mg/dL
Specific Gravity, Urine: 1.001 — ABNORMAL LOW (ref 1.005–1.030)
pH: 7 (ref 5.0–8.0)

## 2018-11-04 LAB — TROPONIN I (HIGH SENSITIVITY)
Troponin I (High Sensitivity): <2 ng/L (ref <18)
Troponin I (High Sensitivity): <2 ng/L (ref <18)

## 2018-11-04 LAB — BASIC METABOLIC PANEL
Anion gap: 8 (ref 5–15)
BUN: 17 mg/dL (ref 6–20)
CO2: 26 mmol/L (ref 22–32)
Calcium: 9.3 mg/dL (ref 8.9–10.3)
Chloride: 104 mmol/L (ref 98–111)
Creatinine, Ser: 0.84 mg/dL (ref 0.44–1.00)
GFR calc Af Amer: >60 mL/min (ref >60)
GFR calc non Af Amer: >60 mL/min (ref >60)
Glucose, Bld: 88 mg/dL (ref 70–99)
Potassium: 3.9 mmol/L (ref 3.5–5.1)
Sodium: 138 mmol/L (ref 135–145)

## 2018-11-04 MED ORDER — SODIUM CHLORIDE 0.9% FLUSH: 3.0000 mL | Freq: Once | INTRAVENOUS | Status: DC

## 2018-11-04 NOTE — ED Notes (Signed)
Pt given beta blocker metoprolol ER at 7pm with cracker and water ( home med).

## 2018-11-04 NOTE — ED Provider Notes (Signed)
[facility] DEPT Provider Note   CSN: [id number] Arrival date & time: 11/04/18  1138     History   Chief Complaint Chief Complaint  Patient presents with  . Hypertension    HPI Jade Daniels is a 48 y.o. female with PMHx fibromyalgia who presents to the ED today with concern for lightheadedness and sudden onset, resolved, sharp, substernal chest pain that occurred earlier today.  Patient reports she has having intermittent episodes of substernal chest pain sometimes radiating into her left arm and up to her left jaw for the past 2 weeks.  This prompted her to go see her PCP who reports did an echocardiogram without obvious findings.  She was advised to follow-up with cardiology given family history of CAD.  She saw cardiology yesterday and is scheduled to have CT of her chest done on November 4.  She was started on 25 mg of metoprolol given high blood pressure.  She reports that she was planning on going to [place] today and read on the answer that she should not take medication if she is driving so she has not taken her blood pressure today.  She reports on her way to [facility] she began feeling lightheaded with a tingling sensation to her lips as well as chest pain.  Patient went back home took her blood pressure and noted it to be high with systolic of 678 -ending her to come to the ED today.  She reports that her symptoms have since resolved.  No recent prolonged travel or immobilization.  No history of DVT/PE.  No exogenous hormone use.  No active malignancy.  No hemoptysis.  She denies fever, chills, cough, shortness of breath, nausea, vomiting, diaphoresis, palpitations, any other associated symptoms.     Past Medical History:  Diagnosis Date  . Fibromyalgia 2/13  . Methylenetetrahydrofolate reductase (MTHFR) gene mutation [facility])    heterozygous    Patient Active Problem List   Diagnosis Date Noted  . Fibromyalgia     Past Surgical  History:  Procedure Laterality Date  . BREAST REDUCTION SURGERY  01/2015   . eyelid surgery    . HYSTEROSCOPY  04/2006   D&C   . LAPAROSCOPIC TOTAL HYSTERECTOMY  11/10/09   TLH  . LIPOSUCTION  01/2015    full torso   . LIPOSUCTION  05/21/2016  . OVARIAN CYST REMOVAL  2008     OB History    Gravida  2   Para  2   Term  2   Preterm      AB      Living  2     SAB      TAB      Ectopic      Multiple      Live Births               Home Medications    Prior to Admission medications   Medication Sig Start Date End Date Taking? Authorizing Provider  aspirin EC 81 MG tablet Take 1 tablet (81 mg total) by mouth daily. 11/03/18  Yes Miquel Dunn, NP  BIOTIN PO Take 1 capsule by mouth daily.   Yes [provider]  cetirizine (ZYRTEC) 10 MG tablet Take 10 mg by mouth daily.   Yes [provider]  Collagen Hydrolysate POWD by Does not apply route.   Yes [provider]  metoprolol succinate (TOPROL-XL) 25 MG 24 hr tablet Take 1 tablet (25 mg total) by mouth  daily. Take with or immediately following a meal. 11/03/18 02/01/19  Miquel Dunn, NP    Family History Family History  Problem Relation Age of Onset  . Hypertension Father   . Thyroid disease Father   . Diabetes Father   . Diabetes Mother     Social History Social History   Tobacco Use  . Smoking status: Never Smoker  . Smokeless tobacco: Never Used  Substance Use Topics  . Alcohol use: Yes    Alcohol/week: 1.0 standard drinks    Types: 1 Glasses of wine per week    Comment: wine once/twice month  . Drug use: No     Allergies   Azithromycin, Bactrim [sulfamethoxazole-trimethoprim], Erythromycin base, Lidocaine, Codeine, Ergocalciferol, and Synthroid [levothyroxine sodium]   Review of Systems Review of Systems  Constitutional: Negative for chills and fever.  HENT: Negative for congestion.   Eyes: Negative for visual disturbance.  Respiratory:  Negative for cough and shortness of breath.   Cardiovascular: Positive for chest pain. Negative for leg swelling.  Gastrointestinal: Negative for abdominal pain, nausea and vomiting.  Genitourinary: Negative for difficulty urinating.  Musculoskeletal: Negative for myalgias.  Skin: Negative for rash.  Neurological: Positive for light-headedness. Negative for syncope.     Physical Exam Updated Vital Signs BP (!) 170/95   Pulse 76   Temp 98.2 F (36.8 C) (Oral)   Resp 18   Wt 76.3 kg   LMP 10/13/2009   SpO2 96%   BMI 28.87 kg/m   Physical Exam Vitals signs and nursing note reviewed.  Constitutional:      Appearance: She is not ill-appearing.  HENT:     Head: Normocephalic and atraumatic.  Eyes:     Conjunctiva/sclera: Conjunctivae normal.  Neck:     Musculoskeletal: Neck supple.  Cardiovascular:     Rate and Rhythm: Normal rate and regular rhythm.     Pulses: Normal pulses.  Pulmonary:     Effort: Pulmonary effort is normal.     Breath sounds: Normal breath sounds. No wheezing, rhonchi or rales.  Abdominal:     Palpations: Abdomen is soft.     Tenderness: There is no abdominal tenderness. There is no guarding or rebound.  Musculoskeletal:     Right lower leg: No edema.     Left lower leg: No edema.  Skin:    General: Skin is warm and dry.  Neurological:     Mental Status: She is alert.     Comments: CN 3-12 grossly intact A&O x4 GCS 15 Sensation and strength intact Gait nonataxic including with tandem walking Coordination with finger-to-nose WNL Neg romberg, neg pronator drift      ED Treatments / Results  Labs (all labs ordered are listed, but only abnormal results are displayed) Labs Reviewed  URINALYSIS, ROUTINE W REFLEX MICROSCOPIC - Abnormal; Notable for the following components:      Result Value   Color, Urine STRAW (*)    Specific Gravity, Urine 1.001 (*)    Hgb urine dipstick MODERATE (*)    Bacteria, UA FEW (*)    All other components  within normal limits  BASIC METABOLIC PANEL  CBC  I-STAT BETA HCG BLOOD, ED ([facility], [facility], [facility] ONLY)  TROPONIN I (HIGH SENSITIVITY)  TROPONIN I (HIGH SENSITIVITY)    EKG EKG Interpretation  Date/Time:  Saturday November 04 2018 12:03:19 EDT Ventricular Rate:  75 PR Interval:    QRS Duration: 77 QT Interval:  376 QTC Calculation: 420 R Axis:   60  Text Interpretation:  Sinus rhythm no acute ST/T changes no significant change since May 2020 Confirmed by Sherwood Gambler [phone]) on 11/04/2018 6:16:14 PM   Radiology Dg Chest 2 View  Result Date: 11/04/2018 CLINICAL DATA:  Chest pain, dizziness EXAM: CHEST - 2 VIEW COMPARISON:  10/05/2018 FINDINGS: The heart size and mediastinal contours are within normal limits. Both lungs are clear. The visualized skeletal structures are unremarkable. IMPRESSION: No acute abnormality of the lungs. Electronically Signed   By: Eddie Candle M.D.   On: 11/04/2018 18:44    Procedures Procedures (including critical care time)  Medications Ordered in ED Medications  sodium chloride flush (NS) 0.9 % injection 3 mL (3 mLs Intravenous Not Given 11/04/18 2137)     Initial Impression / Assessment and Plan / ED Course  I have reviewed the triage vital signs and the nursing notes.  Pertinent labs & imaging results that were available during my care of the patient were reviewed by me and considered in my medical decision making (see chart for details).  48 year old female presents the ED today with concern for high blood pressure.  Just was started on metoprolol yesterday but has not taken anything she was planning on driving to [place] today.  Started having chest pain on her way to [place].  Currently resolved.  Pressure in the ED on arrival 152/109.  Most recent 170/95.    Blood work was obtained prior to being seen - no leukocytosis.  No electrolyte abnormalities.  Urinalysis without infection.  Patient is not pregnant.  Given she was having chest pain today  will obtain serial troponins and chest x-ray.  EKG without ischemic changes.  Patient has no focal neuro deficits on exam today.  She is PERC negative.  We will continue to monitor in the ED.   Troponins less than 2.  X-ray negative.  Patient took her metoprolol while in the ED today.  Most recent blood pressure 126/62.  Vies that she continue to take this and follow-up with her PCP as well as cardiology.  She has CT scheduled for Nov third.  Advised to keep.  Return precautions have been discussed with patient.  She is in agreement with plan at this time is stable for discharge home.       Final Clinical Impressions(s) / ED Diagnoses   Final diagnoses:  Essential hypertension  Nonspecific chest pain    ED Discharge Orders    None       Eustaquio Maize, PA-C 11/04/18 2235    Sherwood Gambler, MD 11/05/18 2351

## 2018-11-04 NOTE — ED Triage Notes (Addendum)
Pt states that she was running errands this morning and had some dizziness. Pt states that she has been having BP issues and was seen by cards yesterday. Pt states BP at home was 174/101. Pt states that she was given rx for BP meds, but has not taken them yet.

## 2018-11-04 NOTE — Discharge Instructions (Signed)
Your labwork, EKG, and chest x ray were all very reassuring today Please continue taking your Metoprolol as prescribed.  Follow up with your PCP regarding your ED visit today.

## 2018-11-13 ENCOUNTER — Telehealth (HOSPITAL_COMMUNITY): Payer: Self-pay | Admitting: Emergency Medicine

## 2018-11-13 NOTE — Telephone Encounter (Signed)
No answering service, unable to leave VM

## 2018-11-14 ENCOUNTER — Encounter (HOSPITAL_COMMUNITY): Payer: Self-pay

## 2018-11-14 ENCOUNTER — Ambulatory Visit (HOSPITAL_COMMUNITY)
Admission: RE | Admit: 2018-11-14 | Discharge: 2018-11-14 | Disposition: A | Discharge status: Home / Self Care | Payer: 59 | Source: Ambulatory Visit | Attending: Cardiology | Admitting: Cardiology

## 2018-11-14 ENCOUNTER — Other Ambulatory Visit: Payer: Self-pay

## 2018-11-14 DIAGNOSIS — I209 Angina pectoris, unspecified: Secondary | ICD-10-CM

## 2018-11-14 NOTE — Progress Notes (Signed)
Patient here for CT heart scan. Patient informed that IV contrast will be given for exam. Patient states she remembers having a reaction to contrast in the past @ a [employer] that included a "very fast rapid heart beat" and the doctor at the time telling her that she should not have contrast again. Patient states she has had CT scans since this episode but never with IV contrast due to reaction. Patient does not feel comfortable proceeding with scan. CT navigator nurse and reading cardiologist of exam notified.

## 2018-11-20 ENCOUNTER — Encounter: Payer: Self-pay | Admitting: Cardiology

## 2018-11-20 ENCOUNTER — Other Ambulatory Visit: Payer: Self-pay

## 2018-11-20 ENCOUNTER — Ambulatory Visit (INDEPENDENT_AMBULATORY_CARE_PROVIDER_SITE_OTHER): Payer: 59 | Admitting: Cardiology

## 2018-11-20 VITALS — BP 131/56 | HR 73 | Ht 64.0 in | Wt 173.7 lb

## 2018-11-20 DIAGNOSIS — R0989 Other specified symptoms and signs involving the circulatory and respiratory systems: Secondary | ICD-10-CM | POA: Diagnosis not present

## 2018-11-20 DIAGNOSIS — I209 Angina pectoris, unspecified: Secondary | ICD-10-CM

## 2018-11-20 NOTE — Progress Notes (Signed)
Primary Physician:  Merrilee Seashore, MD   Patient ID: Jade Daniels, female    DOB: [birth year], 48 y.o.   MRN: [id number]  Subjective:    Chief Complaint  Patient presents with  . Chest Pain  . Follow-up    3wk    HPI: Jade Daniels  is a 48 y.o. female  with fibromyalgia, family history of CAD, recently evaluated by [place] for episodes concerning of angina.  She was started on metoprolol.  Coronary CTA was ordered.  During the interim she had one episode while driving that she suddenly did not feel well and had chest discomfort and tingling sensation in her jaw.  When she returned home her blood pressure was markedly elevated.  She went to the emergency room for further evaluation.  Work-up in the ER was unyielding except for elevated blood pressure.  She had not yet started her metoprolol, she was encouraged to start the medication.  Blood pressure improved with doing so.  She presented for her coronary CTA on 11/2; however, procedure was canceled as patient reported she previously was told to never have contrast dye as on previous CT scan with contrast she had elevated heart rate.  Did not have any anaphylaxis.  This was not reported in her chart.  I have asked her to come in for further discussion.  She reports her blood pressure has been much better since being on metoprolol, but she is experiencing significant dizziness.  She has not had any further episodes of chest pain or jaw tingling sensation.  She is scheduled for labs with her OB/GYN in the next few weeks.  She was not started on statin therapy at her last office visit, as she was reluctant to being on the medication and wanted to have further testing performed first.  She is reluctant to being on any medication.  She had previously exercised at the [facility]. She has been biking over the last few months in view of COVID. She does not smoke.   Past Medical History:  Diagnosis Date  . Fibromyalgia 2/13  . 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 BREAST REDUCTION SURGERY  01/2015   . eyelid surgery    . HYSTEROSCOPY  04/2006   D&C   . LAPAROSCOPIC TOTAL HYSTERECTOMY  11/10/09   [facility]  . LIPOSUCTION  01/2015    full torso   . LIPOSUCTION  05/21/2016  . OVARIAN CYST REMOVAL  2008    Social History   Socioeconomic History  . Marital status: Married    Spouse name: Not on file  . Number of children: 2  . Years of education: Not on file  . Highest education level: Not on file  Occupational History  . Not on file  Social Needs  . Financial resource strain: Not on file  . Food insecurity    Worry: Not on file    Inability: Not on file  . Transportation needs    Medical: Not on file    Non-medical: Not on file  Tobacco Use  . Smoking status: Never Smoker  . Smokeless tobacco: Never Used  Substance and Sexual Activity  . Alcohol use: Yes    Alcohol/week: 1.0 standard drinks    Types: 1 Glasses of wine per week    Comment: wine once/twice month  . Drug use: No  . Sexual activity: Yes    Partners: Male  Birth control/protection: Surgical    Comment: TLH, husband with vasectomy  Lifestyle  . Physical activity    Days per week: Not on file    Minutes per session: Not on file  . Stress: Not on file  Relationships  . Social Herbalist on phone: Not on file    Gets together: Not on file    Attends religious service: Not on file    Active member of club or organization: Not on file    Attends meetings of clubs or organizations: Not on file    Relationship status: Not on file  . Intimate partner violence    Fear of current or ex partner: Not on file    Emotionally abused: Not on file    Physically abused: Not on file    Forced sexual activity: Not on file  Other Topics Concern  . Not on file  Social History Narrative  . Not on file    Review of Systems  Constitution: Negative for  decreased appetite, malaise/fatigue, weight gain and weight loss.  Eyes: Negative for visual disturbance.  Cardiovascular: Positive for chest pain and dyspnea on exertion. Negative for claudication, leg swelling, orthopnea, palpitations and syncope.  Respiratory: Negative for hemoptysis and wheezing.   Endocrine: Negative for cold intolerance and heat intolerance.  Hematologic/Lymphatic: Does not bruise/bleed easily.  Skin: Negative for nail changes.  Musculoskeletal: Negative for muscle weakness and myalgias.  Gastrointestinal: Negative for abdominal pain, change in bowel habit, nausea and vomiting.  Neurological: Negative for difficulty with concentration, dizziness, focal weakness and headaches.  Psychiatric/Behavioral: Negative for altered mental status and suicidal ideas.  All other systems reviewed and are negative.     Objective:  Blood pressure (!) 131/56, pulse 73, height _0  (1.626 m), weight 173 lb 11.2 oz (78.8 kg), last menstrual period 10/13/2009, SpO2 98 %. Body mass index is 29.82 kg/m.    Physical Exam  Constitutional: She is oriented to person, place, and time. Vital signs are normal. She appears well-developed and well-nourished.  HENT:  Head: Normocephalic and atraumatic.  Neck: Normal range of motion.  Cardiovascular: Normal rate, regular rhythm, normal heart sounds and intact distal pulses.  Pulmonary/Chest: Effort normal and breath sounds normal. No accessory muscle usage. No respiratory distress.  Abdominal: Soft. Bowel sounds are normal.  Musculoskeletal: Normal range of motion.  Neurological: She is alert and oriented to person, place, and time.  Skin: Skin is warm and dry.  Vitals reviewed.  Radiology: No results found.  Laboratory examination:   07/01/2018: Creatinine 0.8, EGFR 78, potassium 4.2, glucose 97, CMP otherwise normal.  Thyroid panel normal.  CBC normal. CMP Latest Ref Rng & Units 11/04/2018 05/26/2018 03/26/2018  Glucose 70 - 99 mg/dL 88  103(H) 145(H)  BUN 6 - 20 mg/dL 17 19 23(H)  Creatinine 0.44 - 1.00 mg/dL 0.84 0.90 0.81  Sodium 135 - 145 mmol/L 138 135 137  Potassium 3.5 - 5.1 mmol/L 3.9 4.1 4.3  Chloride 98 - 111 mmol/L 104 102 104  CO2 22 - 32 mmol/L 26 24 20(L)  Calcium 8.9 - 10.3 mg/dL 9.3 9.0 9.2  Total Protein 6.5 - 8.1 g/dL - - 7.9  Total Bilirubin 0.3 - 1.2 mg/dL - - 0.6  Alkaline Phos 38 - 126 U/L - - 55  AST 15 - 41 U/L - - 22  ALT 0 - 44 U/L - - 16   CBC Latest Ref Rng & Units 11/04/2018 05/26/2018 03/26/2018  WBC 4.0 -  10.5 K/uL 7.6 7.6 13.8(H)  Hemoglobin 12.0 - 15.0 g/dL 13.5 13.9 15.3(H)  Hematocrit 36.0 - 46.0 % 41.4 41.2 47.4(H)  Platelets 150 - 400 K/uL 307 266 259   Lipid Panel     Component Value Date/Time   CHOL 196 11/17/2017 0831   TRIG 192 (H) 11/17/2017 0831   HDL 43 11/17/2017 0831   CHOLHDL 4.6 (H) 11/17/2017 0831   LDLCALC 115 (H) 11/17/2017 0831   HEMOGLOBIN A1C Lab Results  Component Value Date   HGBA1C 5.5 11/17/2017   TSH No results for input(s): TSH in the last 8760 hours.  PRN Meds:. Medications Discontinued During This Encounter  Medication Reason  . BIOTIN PO Change in therapy   Current Meds  Medication Sig  . aspirin EC 81 MG tablet Take 1 tablet (81 mg total) by mouth daily. (Patient taking differently: Take 81 mg by mouth. occas)  . cetirizine (ZYRTEC) 10 MG tablet Take 10 mg by mouth as needed.   . Collagen Hydrolysate POWD by Does not apply route.  . metoprolol succinate (TOPROL-XL) 25 MG 24 hr tablet Take 1 tablet (25 mg total) by mouth daily. Take with or immediately following a meal.    Cardiac Studies:   Echo 10/19/2018: Normal LVEF at 60 to 65%.  Mild LVH.  No wall motion abnormalities.  Diastolic dysfunction.  Trace MR.  Mild TR.  Assessment:   Angina pectoris ([place])  Labile hypertension  EKG 11/03/2018: Normal sinus rhythm at 72 bpm, normal axis, no evidence of ischemia.  Recommendations:   Blood pressure has improved since being on  metoprolol, but she is experiencing dizziness.  I will decrease her dose of metoprolol down to half a tablet daily.  But advised her to keep a close eye on her blood pressure and will need to start additional therapy if her blood pressure again elevates.    She has had 1 episode of symptoms of concerning for angina since last seen by [place].  Fortunately evaluation in the emergency room was unyielding at that point.  Continue to feel that CAD will need to be excluded.  I feel that coronary CTA is still her best option as she did not have anaphylaxis with contrast dye and we can certainly premedicate to hopefully prevent symptoms.  She is reluctant to having this performed.  Her other option would be to proceed with exercise nuclear stress test for further evaluation and if her stress testing is abnormal, will need further evaluation at that point.  She is agreeable to proceeding with stress testing.  I will see her back after her test in the next few weeks for close follow-up.   *I have discussed this case with Dr. Einar Gip and he participated in formulating the plan.*   Miquel Dunn, MSN, APRN, FNP-C [facility]. [place] Office: [phone] Fax: [phone]

## 2018-11-23 ENCOUNTER — Telehealth: Payer: Self-pay | Admitting: Cardiology

## 2018-11-23 DIAGNOSIS — I209 Angina pectoris, unspecified: Secondary | ICD-10-CM

## 2018-11-23 NOTE — Telephone Encounter (Signed)
Discussed with the patient after discussing Dr. Einar Gip.  He felt that her best option would be to premedicate with Pepcid and Benadryl prior to her CTA as it would give [place] the best data given her concerning symptoms.She continues to be reluctant to have her procedure.  Her other option is to perform stress testing and then decide if she will need further evaluation. She prefers to have stress testing performed. States that she is feeling better. Will have this scheduled.

## 2018-11-24 ENCOUNTER — Ambulatory Visit: Payer: 59 | Admitting: Cardiology

## 2018-11-24 ENCOUNTER — Other Ambulatory Visit: Payer: Self-pay

## 2018-11-28 ENCOUNTER — Ambulatory Visit: Payer: 59 | Admitting: Obstetrics & Gynecology

## 2018-11-28 ENCOUNTER — Encounter: Payer: Self-pay | Admitting: Obstetrics & Gynecology

## 2018-11-28 ENCOUNTER — Other Ambulatory Visit: Payer: Self-pay

## 2018-11-28 VITALS — BP 128/80 | HR 76 | Temp 97.2°F | Resp 12 | Ht 64.25 in | Wt 172.4 lb

## 2018-11-28 DIAGNOSIS — Z01419 Encounter for gynecological examination (general) (routine) without abnormal findings: Secondary | ICD-10-CM | POA: Diagnosis not present

## 2018-11-28 NOTE — Progress Notes (Signed)
48 y.o. G27P2002 Married Caucasian female here for AEX.  She states she feels "34" this year.  Has been having some angina that she wonders about relation to stressors.  Coronary CT was scheduled but it wasn't done due to prior reaction from IV dye.  She is now scheduled for stress test tomorrow.  May consider having a coronary CT after that.    Parents moved to [place] about six months ago.  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   Denies vaginal bleeding.     Has a one year old granddaughter.  Her name is Jade Daniels.    Patient's last menstrual period was 10/13/2009.          Sexually active: Yes.    The current method of family planning is status post hysterectomy.    Exercising: Yes.    walking Smoker:  no  Health Maintenance: Pap:  06/21/16 neg History of abnormal Pap:  no  MMG:  09/04/18 BIRADS 1 negative Colonoscopy:  never BMD:   never TDaP:  Patient declines  Screening Labs: discuss today if needed   reports that she has never smoked. She has never used smokeless tobacco. She reports current alcohol use of about 1.0 standard drinks of alcohol per week. She reports that she does not use drugs.  Past Medical History:  Diagnosis Date  . Fibromyalgia 2/13  . 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 BREAST REDUCTION SURGERY  01/2015   . eyelid surgery    . HYSTEROSCOPY  04/2006   D&C   . LAPAROSCOPIC TOTAL HYSTERECTOMY  11/10/09   [facility]  . LIPOSUCTION  01/2015    full torso   . LIPOSUCTION  05/21/2016  . OVARIAN CYST REMOVAL  2008    Current Outpatient Medications  Medication Sig Dispense Refill  . aspirin EC 81 MG tablet Take 1 tablet (81 mg total) by mouth daily. (Patient taking differently: Take 81 mg by mouth. occas) 90 tablet 3  . cetirizine (ZYRTEC) 10 MG tablet Take 10 mg by mouth as needed.     . Collagen Hydrolysate POWD by Does not apply route.    .  metoprolol succinate (TOPROL-XL) 25 MG 24 hr tablet Take 1 tablet (25 mg total) by mouth daily. Take with or immediately following a meal. 30 tablet 1   No current facility-administered medications for this visit.     Family History  Problem Relation Age of Onset  . Hypertension Father   . Thyroid disease Father   . Diabetes Father   . Diabetes Mother     Review of Systems  All other systems reviewed and are negative.   Exam:   BP 128/80 (BP Location: Left Arm, Patient Position: Sitting, Cuff Size: Normal)   Pulse 76   Temp (!) 97.2 F (36.2 C) (Temporal)   Resp 12   Ht 5' 4.25" (1.632 m)   Wt 172 lb 6.4 oz (78.2 kg)   LMP 10/13/2009   BMI 29.36 kg/m      Height: 5' 4.25" (163.2 cm)  Ht Readings from Last 3 Encounters:  11/28/18 5' 4.25" (1.632 m)  11/20/18 5' 4"  (1.626 m)  11/04/18 5' 4"  (1.626 m)    General appearance: alert, cooperative and appears stated age Head: Normocephalic, without obvious abnormality, atraumatic Neck: no adenopathy, supple, symmetrical, trachea midline and thyroid normal to inspection and palpation Lungs: clear to auscultation bilaterally Breasts:  normal appearance, no masses or tenderness Heart: regular rate and rhythm Abdomen: soft, non-tender; bowel sounds normal; no masses,  no organomegaly Extremities: extremities normal, atraumatic, no cyanosis or edema Skin: Skin color, texture, turgor normal. No rashes or lesions Lymph nodes: Cervical, supraclavicular, and axillary nodes normal. No abnormal inguinal nodes palpated Neurologic: Grossly normal   Pelvic: External genitalia:  no lesions              Urethra:  normal appearing urethra with no masses, tenderness or lesions              Bartholins and Skenes: normal                 Vagina: normal appearing vagina with normal color and discharge, no lesions              Cervix: absent              Pap taken: No. Bimanual Exam:  Uterus:  uterus absent              Adnexa: no mass,  fullness, tenderness               Rectovaginal: Confirms               Anus:  normal sphincter tone, no lesions  Chaperone was present for exam.  A:  Well Woman with normal exam Fibromyalgia H/o breast reduction and liposuction x 2 MTHFR heterozygous gene positive testing 2014 Angina currently undergoing testing  P:   Mammogram guidelines reviewed pap smear not indicated Colonoscopy referral will be done at age 61 Tdap due.  Pt declines. Return annually or prn

## 2018-11-29 ENCOUNTER — Ambulatory Visit (INDEPENDENT_AMBULATORY_CARE_PROVIDER_SITE_OTHER): Payer: 59

## 2018-11-29 DIAGNOSIS — I209 Angina pectoris, unspecified: Secondary | ICD-10-CM | POA: Diagnosis not present

## 2018-12-06 ENCOUNTER — Ambulatory Visit: Payer: 59 | Admitting: Cardiology

## 2018-12-06 ENCOUNTER — Other Ambulatory Visit: Payer: Self-pay

## 2018-12-06 ENCOUNTER — Encounter: Payer: Self-pay | Admitting: Cardiology

## 2018-12-06 ENCOUNTER — Ambulatory Visit (INDEPENDENT_AMBULATORY_CARE_PROVIDER_SITE_OTHER): Payer: 59 | Admitting: Cardiology

## 2018-12-06 VITALS — BP 138/77 | HR 71 | Ht 64.0 in | Wt 176.0 lb

## 2018-12-06 DIAGNOSIS — E785 Hyperlipidemia, unspecified: Secondary | ICD-10-CM | POA: Diagnosis not present

## 2018-12-06 DIAGNOSIS — R002 Palpitations: Secondary | ICD-10-CM | POA: Diagnosis not present

## 2018-12-06 DIAGNOSIS — I10 Essential (primary) hypertension: Secondary | ICD-10-CM | POA: Diagnosis not present

## 2018-12-06 NOTE — Progress Notes (Signed)
Primary Physician:  Merrilee Seashore, MD   Patient ID: Jade Daniels, female    DOB: 1970-04-06, 48 y.o.   MRN: [id number]  Subjective:    Chief Complaint  Patient presents with  . Hypertension    HPI: Jade Daniels  is a 48 y.o. female  with fibromyalgia, family history of CAD, recently evaluated by [place] for episodes concerning of angina.  She was started on metoprolol both for angina and hypertension.  Dosage was reduced at her last appointment due to dizziness, she is now taking 12-1/2 a.m. and p.m. and has had improvement in dizziness.  We had originally planned on coronary CTA, but was canceled as patient had reported allergy to contrast dye.  She underwent exercise nuclear stress testing and now presents for follow-up.  She has not had any recurrence of chest discomfort radiating up to her jaw.  She is overall feeling well.  She does report few days ago having an episode of palpitations and belching after eating [place].  She describes them as flip-flopping in her chest.  She is not on statin therapy at this time, she is hoping to avoid this.  She was planning on having lipids performed at her gynecologist, but this was not performed.  She has been to see her PCP in the next few weeks.  She had previously exercised at the [facility]. She has been biking over the last few months in view of COVID. She does not smoke.   Past Medical History:  Diagnosis Date  . Fibromyalgia 2/13  . 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 BREAST REDUCTION SURGERY  01/2015   . eyelid surgery    . HYSTEROSCOPY  04/2006   D&C   . LAPAROSCOPIC TOTAL HYSTERECTOMY  11/10/09   [facility]  . LIPOSUCTION  01/2015    full torso   . LIPOSUCTION  05/21/2016  . OVARIAN CYST REMOVAL  2008    Social History   Socioeconomic History  . Marital status: Married    Spouse name: Not on file  . Number of children: 2  .  Years of education: Not on file  . Highest education level: Not on file  Occupational History  . Not on file  Social Needs  . Financial resource strain: Not on file  . Food insecurity    Worry: Not on file    Inability: Not on file  . Transportation needs    Medical: Not on file    Non-medical: Not on file  Tobacco Use  . Smoking status: Never Smoker  . Smokeless tobacco: Never Used  Substance and Sexual Activity  . Alcohol use: Yes    Alcohol/week: 1.0 standard drinks    Types: 1 Glasses of wine per week    Comment: wine once/twice month  . Drug use: No  . Sexual activity: Yes    Partners: Male    Birth control/protection: Surgical    Comment: TLH, husband with vasectomy  Lifestyle  . Physical activity    Days per week: Not on file    Minutes per session: Not on file  . Stress: Not on file  Relationships  . Social Herbalist on phone: Not on file    Gets together: Not on file    Attends religious service: Not on file    Active member of club or organization: Not on file    Attends meetings  of clubs or organizations: Not on file    Relationship status: Not on file  . Intimate partner violence    Fear of current or ex partner: Not on file    Emotionally abused: Not on file    Physically abused: Not on file    Forced sexual activity: Not on file  Other Topics Concern  . Not on file  Social History Narrative  . Not on file    Review of Systems  Constitution: Negative for decreased appetite, malaise/fatigue, weight gain and weight loss.  Eyes: Negative for visual disturbance.  Cardiovascular: Positive for dyspnea on exertion. Negative for chest pain, claudication, leg swelling, orthopnea, palpitations and syncope.  Respiratory: Negative for hemoptysis and wheezing.   Endocrine: Negative for cold intolerance and heat intolerance.  Hematologic/Lymphatic: Does not bruise/bleed easily.  Skin: Negative for nail changes.  Musculoskeletal: Negative for muscle  weakness and myalgias.  Gastrointestinal: Negative for abdominal pain, change in bowel habit, nausea and vomiting.  Neurological: Negative for difficulty with concentration, dizziness, focal weakness and headaches.  Psychiatric/Behavioral: Negative for altered mental status and suicidal ideas.  All other systems reviewed and are negative.     Objective:  Blood pressure 138/77, pulse 71, height 5' 4"  (1.626 m), weight 176 lb (79.8 kg), last menstrual period 10/13/2009, SpO2 97 %. Body mass index is 30.21 kg/m.    Physical Exam  Constitutional: She is oriented to person, place, and time. Vital signs are normal. She appears well-developed and well-nourished.  HENT:  Head: Normocephalic and atraumatic.  Neck: Normal range of motion.  Cardiovascular: Normal rate, regular rhythm, normal heart sounds and intact distal pulses.  Pulmonary/Chest: Effort normal and breath sounds normal. No accessory muscle usage. No respiratory distress.  Abdominal: Soft. Bowel sounds are normal.  Musculoskeletal: Normal range of motion.  Neurological: She is alert and oriented to person, place, and time.  Skin: Skin is warm and dry.  Vitals reviewed.  Radiology: No results found.  Laboratory examination:   07/01/2018: Creatinine 0.8, EGFR 78, potassium 4.2, glucose 97, CMP otherwise normal.  Thyroid panel normal.  CBC normal. CMP Latest Ref Rng & Units 11/04/2018 05/26/2018 03/26/2018  Glucose 70 - 99 mg/dL 88 103(H) 145(H)  BUN 6 - 20 mg/dL 17 19 23(H)  Creatinine 0.44 - 1.00 mg/dL 0.84 0.90 0.81  Sodium 135 - 145 mmol/L 138 135 137  Potassium 3.5 - 5.1 mmol/L 3.9 4.1 4.3  Chloride 98 - 111 mmol/L 104 102 104  CO2 22 - 32 mmol/L 26 24 20(L)  Calcium 8.9 - 10.3 mg/dL 9.3 9.0 9.2  Total Protein 6.5 - 8.1 g/dL - - 7.9  Total Bilirubin 0.3 - 1.2 mg/dL - - 0.6  Alkaline Phos 38 - 126 U/L - - 55  AST 15 - 41 U/L - - 22  ALT 0 - 44 U/L - - 16   CBC Latest Ref Rng & Units 11/04/2018 05/26/2018 03/26/2018  WBC  4.0 - 10.5 K/uL 7.6 7.6 13.8(H)  Hemoglobin 12.0 - 15.0 g/dL 13.5 13.9 15.3(H)  Hematocrit 36.0 - 46.0 % 41.4 41.2 47.4(H)  Platelets 150 - 400 K/uL 307 266 259   Lipid Panel     Component Value Date/Time   CHOL 196 11/17/2017 0831   TRIG 192 (H) 11/17/2017 0831   HDL 43 11/17/2017 0831   CHOLHDL 4.6 (H) 11/17/2017 0831   LDLCALC 115 (H) 11/17/2017 0831   HEMOGLOBIN A1C Lab Results  Component Value Date   HGBA1C 5.5 11/17/2017   TSH  No results for input(s): TSH in the last 8760 hours.  PRN Meds:. There are no discontinued medications. Current Meds  Medication Sig  . aspirin EC 81 MG tablet Take 1 tablet (81 mg total) by mouth daily. (Patient taking differently: Take 81 mg by mouth. occas)  . cetirizine (ZYRTEC) 10 MG tablet Take 10 mg by mouth as needed.   . Collagen Hydrolysate POWD by Does not apply route.  . metoprolol succinate (TOPROL-XL) 25 MG 24 hr tablet Take 1 tablet (25 mg total) by mouth daily. Take with or immediately following a meal.    Cardiac Studies:   Exercise tetrofosmin stress test  11/29/2018: Normal ECG stress. Patient exercised on the Bruce protocol for a total of 8 minutes and 30 seconds, achieving approximately 10.16 METs. Stress terminated due to fatigue and achieving THR. Fixed defect located in the mid inferoseptal wall and basal inferoseptal wall due to soft tissue attenuation noted. Stress LV EF: 65%.  Low risk stress test. No previous exam available for comparison.  Echo 10/19/2018: Normal LVEF at 60 to 65%.  Mild LVH.  No wall motion abnormalities.  Diastolic dysfunction.  Trace MR.  Mild TR.  Assessment:   Essential hypertension  Mild hyperlipidemia  Palpitations  EKG 11/03/2018: Normal sinus rhythm at 72 bpm, normal axis, no evidence of ischemia.  Recommendations:   I discussed recently obtained exercise nuclear stress test results, considered low risk study.  I suspect her episodes of chest pain potentially related to  hypertension and anxiety given her reassuring cardiac work-up. She has not had any episodes of angina recently. If she has recurrence of symptoms, given her family history, would recommend coronary CTA at that time.  We will continue with metoprolol 25 mg daily.  She has had improvement in dizziness with splitting her dose a.m. and p.m., will continue same.  Her blood pressure has now been stable.  Encouraged her to continue with primary prevention measures with diet modifications and regular exercise.  She did not have lipids performed with her gynecologist, but is planning to see her PCP in the next few weeks will have it performed at that time.  She continues to have elevated lipids, will need to further discuss starting statin therapy given her risk factors.  She does report one episode of palpitations that is suggestive of PVCs.  Feel that this was likely precipitated by GERD.  Encouraged her to use over-the-counter acid reflux medications as needed to see if she has improvement in her symptoms.  Encouraged her to contact me should she have any worsening or changes to this.  I will see her back in 3 months for follow-up, but encouraged her to contact me sooner if needed.   Miquel Dunn, MSN, APRN, FNP-C [facility]. [place] Office: [phone] Fax: [phone]

## 2018-12-31 ENCOUNTER — Other Ambulatory Visit: Payer: Self-pay | Admitting: Cardiology

## 2019-02-21 ENCOUNTER — Other Ambulatory Visit: Payer: Self-pay

## 2019-02-21 MED ORDER — METOPROLOL SUCCINATE ER 25 MG PO TB24: ORAL_TABLET | ORAL | 1 refills | Status: DC

## 2019-03-08 ENCOUNTER — Ambulatory Visit: Payer: 59 | Admitting: Cardiology

## 2019-03-08 NOTE — Progress Notes (Deleted)
Primary Physician:  Merrilee Seashore, MD   Patient ID: Jade Daniels, female    DOB: 11/20/1970, 49 y.o.   MRN: [id number]  Subjective:    No chief complaint on file.   HPI: Jade Daniels  is a 49 y.o. female  with fibromyalgia, family history of CAD, recently evaluated by [place] for episodes concerning of angina.  She was started on metoprolol both for angina and hypertension.  Dosage was reduced at her last appointment due to dizziness, she is now taking 12-1/2 a.m. and p.m. and has had improvement in dizziness.  We had originally planned on coronary CTA, but was canceled as patient had reported allergy to contrast dye.  She underwent exercise nuclear stress testing and now presents for follow-up.  She has not had any recurrence of chest discomfort radiating up to her jaw.  She is overall feeling well.  She does report few days ago having an episode of palpitations and belching after eating [place].  She describes them as flip-flopping in her chest.  She is not on statin therapy at this time, she is hoping to avoid this.  She was planning on having lipids performed at her gynecologist, but this was not performed.  She has been to see her PCP in the next few weeks.  She had previously exercised at the [facility]. She has been biking over the last few months in view of COVID. She does not smoke.   Past Medical History:  Diagnosis Date  . Fibromyalgia 2/13  . 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 BREAST REDUCTION SURGERY  01/2015   . eyelid surgery    . HYSTEROSCOPY  04/2006   D&C   . LAPAROSCOPIC TOTAL HYSTERECTOMY  11/10/09   TLH  . LIPOSUCTION  01/2015    full torso   . LIPOSUCTION  05/21/2016  . OVARIAN CYST REMOVAL  2008    Social History   Socioeconomic History  . Marital status: Married    Spouse name: Not on file  . Number of children: 2  . Years of education: Not on file    . Highest education level: Not on file  Occupational History  . Not on file  Tobacco Use  . Smoking status: Never Smoker  . Smokeless tobacco: Never Used  Substance and Sexual Activity  . Alcohol use: Yes    Alcohol/week: 1.0 standard drinks    Types: 1 Glasses of wine per week    Comment: wine once/twice month  . Drug use: No  . Sexual activity: Yes    Partners: Male    Birth control/protection: Surgical    Comment: TLH, husband with vasectomy  Other Topics Concern  . Not on file  Social History Narrative  . Not on file   Social Determinants of Health   Financial Resource Strain:   . Difficulty of Paying Living Expenses: Not on file  Food Insecurity:   . Worried About Charity fundraiser in the Last Year: Not on file  . Ran Out of Food in the Last Year: Not on file  Transportation Needs:   . Lack of Transportation (Medical): Not on file  . Lack of Transportation (Non-Medical): Not on file  Physical Activity:   . Days of Exercise per Week: Not on file  . Minutes of Exercise per Session: Not on file  Stress:   . Feeling of Stress : Not on file  Social Connections:   . Frequency of Communication with Friends and Family: Not on file  . Frequency of Social Gatherings with Friends and Family: Not on file  . Attends Religious Services: Not on file  . Active Member of Clubs or Organizations: Not on file  . Attends Archivist Meetings: Not on file  . Marital Status: Not on file  Intimate Partner Violence:   . Fear of Current or Ex-Partner: Not on file  . Emotionally Abused: Not on file  . Physically Abused: Not on file  . Sexually Abused: Not on file    Review of Systems  Constitution: Negative for decreased appetite, malaise/fatigue, weight gain and weight loss.  Eyes: Negative for visual disturbance.  Cardiovascular: Positive for dyspnea on exertion. Negative for chest pain, claudication, leg swelling, orthopnea, palpitations and syncope.  Respiratory:  Negative for hemoptysis and wheezing.   Endocrine: Negative for cold intolerance and heat intolerance.  Hematologic/Lymphatic: Does not bruise/bleed easily.  Skin: Negative for nail changes.  Musculoskeletal: Negative for muscle weakness and myalgias.  Gastrointestinal: Negative for abdominal pain, change in bowel habit, nausea and vomiting.  Neurological: Negative for difficulty with concentration, dizziness, focal weakness and headaches.  Psychiatric/Behavioral: Negative for altered mental status and suicidal ideas.  All other systems reviewed and are negative.     Objective:  Last menstrual period 10/13/2009. There is no height or weight on file to calculate BMI.    Physical Exam  Constitutional: She is oriented to person, place, and time. Vital signs are normal. She appears well-developed and well-nourished.  HENT:  Head: Normocephalic and atraumatic.  Cardiovascular: Normal rate, regular rhythm, normal heart sounds and intact distal pulses.  Pulmonary/Chest: Effort normal and breath sounds normal. No accessory muscle usage. No respiratory distress.  Abdominal: Soft. Bowel sounds are normal.  Musculoskeletal:        General: Normal range of motion.     Cervical back: Normal range of motion.  Neurological: She is alert and oriented to person, place, and time.  Skin: Skin is warm and dry.  Vitals reviewed.  Radiology: No results found.  Laboratory examination:   07/01/2018: Creatinine 0.8, EGFR 78, potassium 4.2, glucose 97, CMP otherwise normal.  Thyroid panel normal.  CBC normal. CMP Latest Ref Rng & Units 11/04/2018 05/26/2018 03/26/2018  Glucose 70 - 99 mg/dL 88 103(H) 145(H)  BUN 6 - 20 mg/dL 17 19 23(H)  Creatinine 0.44 - 1.00 mg/dL 0.84 0.90 0.81  Sodium 135 - 145 mmol/L 138 135 137  Potassium 3.5 - 5.1 mmol/L 3.9 4.1 4.3  Chloride 98 - 111 mmol/L 104 102 104  CO2 22 - 32 mmol/L 26 24 20(L)  Calcium 8.9 - 10.3 mg/dL 9.3 9.0 9.2  Total Protein 6.5 - 8.1 g/dL - - 7.9    Total Bilirubin 0.3 - 1.2 mg/dL - - 0.6  Alkaline Phos 38 - 126 U/L - - 55  AST 15 - 41 U/L - - 22  ALT 0 - 44 U/L - - 16   CBC Latest Ref Rng & Units 11/04/2018 05/26/2018 03/26/2018  WBC 4.0 - 10.5 K/uL 7.6 7.6 13.8(H)  Hemoglobin 12.0 - 15.0 g/dL 13.5 13.9 15.3(H)  Hematocrit 36.0 - 46.0 % 41.4 41.2 47.4(H)  Platelets 150 - 400 K/uL 307 266 259   Lipid Panel     Component Value Date/Time   CHOL 196 11/17/2017 0831   TRIG 192 (H) 11/17/2017 0831   HDL 43 11/17/2017 0831   CHOLHDL 4.6 (H) 11/17/2017 0831  LDLCALC 115 (H) 11/17/2017 0831   HEMOGLOBIN A1C Lab Results  Component Value Date   HGBA1C 5.5 11/17/2017   TSH No results for input(s): TSH in the last 8760 hours.  PRN Meds:. There are no discontinued medications. No outpatient medications have been marked as taking for the 03/08/19 encounter (Appointment) with Miquel Dunn, NP.    Cardiac Studies:   Exercise tetrofosmin stress test  11/29/2018: Normal ECG stress. Patient exercised on the Bruce protocol for a total of 8 minutes and 30 seconds, achieving approximately 10.16 METs. Stress terminated due to fatigue and achieving THR. Fixed defect located in the mid inferoseptal wall and basal inferoseptal wall due to soft tissue attenuation noted. Stress LV EF: 65%.  Low risk stress test. No previous exam available for comparison.  Echo 10/19/2018: Normal LVEF at 60 to 65%.  Mild LVH.  No wall motion abnormalities.  Diastolic dysfunction.  Trace MR.  Mild TR.  Assessment:   No diagnosis found.  EKG 11/03/2018: Normal sinus rhythm at 72 bpm, normal axis, no evidence of ischemia.  Recommendations:   I discussed recently obtained exercise nuclear stress test results, considered low risk study.  I suspect her episodes of chest pain potentially related to hypertension and anxiety given her reassuring cardiac work-up. She has not had any episodes of angina recently. If she has recurrence of symptoms, given her  family history, would recommend coronary CTA at that time.  We will continue with metoprolol 25 mg daily.  She has had improvement in dizziness with splitting her dose a.m. and p.m., will continue same.  Her blood pressure has now been stable.  Encouraged her to continue with primary prevention measures with diet modifications and regular exercise.  She did not have lipids performed with her gynecologist, but is planning to see her PCP in the next few weeks will have it performed at that time.  She continues to have elevated lipids, will need to further discuss starting statin therapy given her risk factors.  She does report one episode of palpitations that is suggestive of PVCs.  Feel that this was likely precipitated by GERD.  Encouraged her to use over-the-counter acid reflux medications as needed to see if she has improvement in her symptoms.  Encouraged her to contact me should she have any worsening or changes to this.  I will see her back in 3 months for follow-up, but encouraged her to contact me sooner if needed.   Miquel Dunn, MSN, APRN, FNP-C [facility]. [place] Office: [phone] Fax: [phone]

## 2019-07-12 ENCOUNTER — Ambulatory Visit: Payer: 59 | Admitting: Cardiology

## 2019-07-18 ENCOUNTER — Telehealth: Payer: Self-pay

## 2019-07-18 NOTE — Telephone Encounter (Signed)
NOTES ON FILE FROM [facility] [phone], SENT REFERRAL TO SCHEDULING

## 2019-08-06 ENCOUNTER — Other Ambulatory Visit: Payer: Self-pay | Admitting: Cardiology

## 2019-08-06 DIAGNOSIS — R002 Palpitations: Secondary | ICD-10-CM

## 2019-08-06 DIAGNOSIS — I10 Essential (primary) hypertension: Secondary | ICD-10-CM

## 2019-08-06 MED ORDER — METOPROLOL SUCCINATE ER 25 MG PO TB24: ORAL_TABLET | ORAL | 0 refills | Status: DC

## 2019-08-16 ENCOUNTER — Other Ambulatory Visit: Payer: 59

## 2019-08-16 ENCOUNTER — Other Ambulatory Visit: Payer: Self-pay

## 2019-08-16 DIAGNOSIS — Z20822 Contact with and (suspected) exposure to covid-19: Secondary | ICD-10-CM

## 2019-08-17 LAB — SARS-COV-2, NAA 2 DAY TAT

## 2019-08-17 LAB — NOVEL CORONAVIRUS, NAA: SARS-CoV-2, NAA: NOT DETECTED

## 2019-09-05 NOTE — Progress Notes (Signed)
Cardiology Office Note:    Date:  09/07/2019   ID:  Jade Daniels, DOB 1970/10/05, MRN [id number]  PCP:  Merrilee Seashore, MD  Cardiologist:  No primary care provider on file.  Electrophysiologist:  None   Referring MD: Merrilee Seashore, MD   Chief Complaint: Palpitations, chest pain, HTN  History of Present Illness:    Jade Daniels is a 49 y.o. female with a history of hypertension, fibromyalgia, family history of coronary artery disease and recent evaluation for chest pain.  She is a non-smoker.  She was seen by [facility] cardiovascular in November 2020.  She had a low risk stress test performed 11/29/2018.  She was started on metoprolol for angina and hypertension, but experienced dizziness and her dose was reduced to 12.5 mg twice daily with improvement in symptoms.  She was originally intended to have a coronary CTA but reported an allergy to contrast dye and this was deferred in favor of a nuclear stress test.  She has described palpitations in the past and belching after eating [place] food.  She describes this as a flip-flopping in her chest.  She follows with her primary care physician for routine laboratory work. Referral records from primary care physician are not available at the time of this consultation, history obtained from the medical record and patient report.  Today she feels well and has had no significant recurrence of chest pain since transitioning to a different formulation of Toprol XL 25 mg daily. Palpitations have improved greatly.   She exercises by biking since COVID, but has not been able to do that as much since moving to [place]. We discussed diet and lifestyle modification in depth.   Past Medical History:  Diagnosis Date  . Fibromyalgia 2/13  . 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 BREAST REDUCTION SURGERY  01/2015   . eyelid surgery    .  HYSTEROSCOPY  04/2006   D&C   . LAPAROSCOPIC TOTAL HYSTERECTOMY  11/10/09   TLH  . LIPOSUCTION  01/2015    full torso   . LIPOSUCTION  05/21/2016  . OVARIAN CYST REMOVAL  2008    Current Medications: No outpatient medications have been marked as taking for the 09/07/19 encounter (Office Visit) with Elouise Munroe, MD.     Allergies:   Azithromycin, Bactrim [sulfamethoxazole-trimethoprim], Erythromycin base, Lidocaine, Codeine, Ergocalciferol, and Synthroid [levothyroxine sodium]   Social History   Socioeconomic History  . Marital status: Married    Spouse name: Not on file  . Number of children: 2  . Years of education: Not on file  . Highest education level: Not on file  Occupational History  . Not on file  Tobacco Use  . Smoking status: Never Smoker  . Smokeless tobacco: Never Used  Vaping Use  . Vaping Use: Never used  Substance and Sexual Activity  . Alcohol use: Yes    Alcohol/week: 1.0 standard drink    Types: 1 Glasses of wine per week    Comment: wine once/twice month  . Drug use: No  . Sexual activity: Yes    Partners: Male    Birth control/protection: Surgical    Comment: TLH, husband with vasectomy  Other Topics Concern  . Not on file  Social History Narrative  . Not on file   Social Determinants of Health   Financial Resource Strain:   . Difficulty of Paying Living Expenses: Not on file  Food Insecurity:   . Worried About Charity fundraiser in the Last Year: Not on file  . Ran Out of Food in the Last Year: Not on file  Transportation Needs:   . Lack of Transportation (Medical): Not on file  . Lack of Transportation (Non-Medical): Not on file  Physical Activity:   . Days of Exercise per Week: Not on file  . Minutes of Exercise per Session: Not on file  Stress:   . Feeling of Stress : Not on file  Social Connections:   . Frequency of Communication with Friends and Family: Not on file  . Frequency of Social Gatherings with Friends and Family:  Not on file  . Attends Religious Services: Not on file  . Active Member of Clubs or Organizations: Not on file  . Attends Archivist Meetings: Not on file  . Marital Status: Not on file     Family History: The patient's family history includes Diabetes in her father and mother; Hypertension in her father; Thyroid disease in her father.  ROS:   Please see the history of present illness.    All other systems reviewed and are negative.  EKGs/Labs/Other Studies Reviewed:    The following studies were reviewed today:  EKG:  NSR, rate 69  I have independently reviewed the images from CXR 11/04/2018.  Recent Labs: 11/04/2018: BUN 17; Creatinine, Ser 0.84; Hemoglobin 13.5; Platelets 307; Potassium 3.9; Sodium 138  Recent Lipid Panel    Component Value Date/Time   CHOL 196 11/17/2017 0831   TRIG 192 (H) 11/17/2017 0831   HDL 43 11/17/2017 0831   CHOLHDL 4.6 (H) 11/17/2017 0831   LDLCALC 115 (H) 11/17/2017 0831    Physical Exam:    VS:  BP 130/88   Pulse 69   Temp (!) 97 F (36.1 C)   Ht 5' 4"  (1.626 m)   Wt 176 lb (79.8 kg)   LMP 10/13/2009   SpO2 98%   BMI 30.21 kg/m     Wt Readings from Last 5 Encounters:  09/07/19 176 lb (79.8 kg)  12/06/18 176 lb (79.8 kg)  11/28/18 172 lb 6.4 oz (78.2 kg)  11/20/18 173 lb 11.2 oz (78.8 kg)  11/04/18 168 lb 3.2 oz (76.3 kg)     Constitutional: No acute distress Eyes: sclera non-icteric, normal conjunctiva and lids ENMT: normal dentition, moist mucous membranes Cardiovascular: regular rhythm, normal rate, no murmurs. S1 and S2 normal. Radial pulses normal bilaterally. No jugular venous distention.  Respiratory: clear to auscultation bilaterally GI : normal bowel sounds, soft and nontender. No distention.   MSK: extremities warm, well perfused. No edema.  NEURO: grossly nonfocal exam, moves all extremities. PSYCH: alert and oriented x 3, normal mood and affect.   ASSESSMENT:    1. Chest pain, unspecified type     2. Palpitations   3. Essential hypertension   4. Mixed hyperlipidemia    PLAN:    Chest pain, unspecified type - Plan: EKG 12-Lead -She has done very well on Toprol-XL 25 mg daily, with minimal recurrence of symptoms since being on this formulation.  We will continue and observe symptoms.  Could consider as needed nitroglycerin.  It is possible her symptoms are coming from endothelial dysfunction/microvascular disease.  She is doing well on beta-blockade as antianginal and antihypertensive therapy.  She thinks a large portion of her chest pain is due to stress.  Palpitations-greatly improved on Toprol-XL 25 mg daily, continue.  Okay to refill when she runs  out.  Essential hypertension-blood pressure with reasonable control on monotherapy with Toprol-XL 25 mg daily, continue at this time.  Mixed hyperlipidemia-she would prefer a strategy of diet lifestyle modification initially.  Her last LDL was 120 and triglycerides were 190.  It is reasonable to attempt this to start.  We have had a discussion of the benefits of medication therapy when lipids cannot be well managed with diet and exercise alone.  She recently had repeat lab work with her integrative provider outside of the [facility] system, I have requested that she have these laboratory studies faxed to [place] to that we can review them for next visit.  She has been told that she may have some insulin resistance and needs a review of her lipids.  Cardiovascular risk reduction-discussed diet lifestyle modification in detail and strategies for optimal cardiovascular health.  Total time of encounter: 60 minutes total time of encounter, including 40 minutes spent in face-to-face patient care on the date of this encounter. This time includes coordination of care and counseling regarding above mentioned problem list. Remainder of non-face-to-face time involved reviewing chart documents/testing relevant to the patient encounter and documentation in the medical  record. I have independently reviewed documentation from previously cardiovascular provider.   Cherlynn Kaiser, MD [place]  [facility]    Medication Adjustments/Labs and Tests Ordered: Current medicines are reviewed at length with the patient today.  Concerns regarding medicines are outlined above.  Orders Placed This Encounter  Procedures  . EKG 12-Lead   No orders of the defined types were placed in this encounter.   Patient Instructions  Medication Instructions:  Your Physician recommend you continue on your current medication as directed.    If you have reoccurring chest pain, please call our office for a prescription for Nitroglycerin.  *If you need a refill on your cardiac medications before your next appointment, please call your pharmacy*   Lab Work: None   Testing/Procedures: None   Follow-Up: At [facility], you and your health needs are our priority.  As part of our continuing mission to provide you with exceptional heart care, we have created designated Provider Care Teams.  These Care Teams include your primary Cardiologist (physician) and Advanced Practice Providers (APPs -  Physician Assistants and Nurse Practitioners) who all work together to provide you with the care you need, when you need it.  We recommend signing up for the patient portal called "MyChart".  Sign up information is provided on this After Visit Summary.  MyChart is used to connect with patients for Virtual Visits (Telemedicine).  Patients are able to view lab/test results, encounter notes, upcoming appointments, etc.  Non-urgent messages can be sent to your provider as well.   To learn more about what you can do with MyChart, go to [email or web address].    Your next appointment:   6 month(s)  The format for your next appointment:   In Person  Provider:   Cherlynn Kaiser, MD

## 2019-09-07 ENCOUNTER — Ambulatory Visit: Payer: 59 | Admitting: Internal Medicine

## 2019-09-07 ENCOUNTER — Other Ambulatory Visit: Payer: Self-pay

## 2019-09-07 ENCOUNTER — Encounter: Payer: Self-pay | Admitting: Internal Medicine

## 2019-09-07 VITALS — BP 130/88 | HR 69 | Temp 97.0°F | Ht 64.0 in | Wt 176.0 lb

## 2019-09-07 DIAGNOSIS — R002 Palpitations: Secondary | ICD-10-CM | POA: Diagnosis not present

## 2019-09-07 DIAGNOSIS — I10 Essential (primary) hypertension: Secondary | ICD-10-CM | POA: Diagnosis not present

## 2019-09-07 DIAGNOSIS — E782 Mixed hyperlipidemia: Secondary | ICD-10-CM

## 2019-09-07 DIAGNOSIS — R079 Chest pain, unspecified: Secondary | ICD-10-CM

## 2019-09-07 NOTE — Patient Instructions (Signed)
Medication Instructions:  Your Physician recommend you continue on your current medication as directed.    If you have reoccurring chest pain, please call our office for a prescription for Nitroglycerin.  *If you need a refill on your cardiac medications before your next appointment, please call your pharmacy*   Lab Work: None   Testing/Procedures: None   Follow-Up: At [facility], you and your health needs are our priority.  As part of our continuing mission to provide you with exceptional heart care, we have created designated Provider Care Teams.  These Care Teams include your primary Cardiologist (physician) and Advanced Practice Providers (APPs -  Physician Assistants and Nurse Practitioners) who all work together to provide you with the care you need, when you need it.  We recommend signing up for the patient portal called "MyChart".  Sign up information is provided on this After Visit Summary.  MyChart is used to connect with patients for Virtual Visits (Telemedicine).  Patients are able to view lab/test results, encounter notes, upcoming appointments, etc.  Non-urgent messages can be sent to your provider as well.   To learn more about what you can do with MyChart, go to [email or web address].    Your next appointment:   6 month(s)  The format for your next appointment:   In Person  Provider:   Cherlynn Kaiser, MD

## 2019-11-01 ENCOUNTER — Other Ambulatory Visit: Payer: 59

## 2019-11-01 DIAGNOSIS — Z20822 Contact with and (suspected) exposure to covid-19: Secondary | ICD-10-CM

## 2019-11-03 LAB — NOVEL CORONAVIRUS, NAA: SARS-CoV-2, NAA: NOT DETECTED

## 2019-11-03 LAB — SARS-COV-2, NAA 2 DAY TAT

## 2019-11-08 ENCOUNTER — Other Ambulatory Visit: Payer: Self-pay | Admitting: Internal Medicine

## 2019-11-08 DIAGNOSIS — R002 Palpitations: Secondary | ICD-10-CM

## 2019-11-08 DIAGNOSIS — I10 Essential (primary) hypertension: Secondary | ICD-10-CM

## 2019-11-08 MED ORDER — METOPROLOL SUCCINATE ER 25 MG PO TB24: ORAL_TABLET | ORAL | 1 refills | Status: DC

## 2019-11-08 NOTE — Telephone Encounter (Signed)
Rx has been sent to the pharmacy electronically. ° °

## 2019-11-08 NOTE — Telephone Encounter (Signed)
*  STAT* If patient is at the pharmacy, call can be transferred to refill team.   1. Which medications need to be refilled? (please list name of each medication and dose if known)  metoprolol succinate (TOPROL-XL) 25 MG 24 hr tablet    2. Which pharmacy/location (including street and city if local pharmacy) is medication to be sent to? [place], [place]  3. Do they need a 30 day or 90 day supply? [place]

## 2019-11-15 ENCOUNTER — Other Ambulatory Visit: Payer: Self-pay | Admitting: Nurse Practitioner

## 2019-11-15 ENCOUNTER — Ambulatory Visit (HOSPITAL_COMMUNITY)
Admission: RE | Admit: 2019-11-15 | Discharge: 2019-11-15 | Disposition: A | Discharge status: Home / Self Care | Payer: 59 | Source: Ambulatory Visit | Attending: Pulmonary Disease | Admitting: Pulmonary Disease

## 2019-11-15 DIAGNOSIS — I10 Essential (primary) hypertension: Secondary | ICD-10-CM

## 2019-11-15 DIAGNOSIS — U071 COVID-19: Secondary | ICD-10-CM | POA: Insufficient documentation

## 2019-11-15 MED ORDER — ALBUTEROL SULFATE HFA 108 (90 BASE) MCG/ACT IN AERS: 2.0000 | INHALATION_SPRAY | Freq: Once | RESPIRATORY_TRACT | Status: DC | PRN

## 2019-11-15 MED ORDER — FAMOTIDINE IN NACL 20-0.9 MG/50ML-% IV SOLN: 20.0000 mg | Freq: Once | INTRAVENOUS | Status: DC | PRN

## 2019-11-15 MED ORDER — METHYLPREDNISOLONE SODIUM SUCC 125 MG IJ SOLR: 125.0000 mg | Freq: Once | INTRAMUSCULAR | Status: DC | PRN

## 2019-11-15 MED ORDER — EPINEPHRINE 0.3 MG/0.3ML IJ SOAJ: 0.3000 mg | Freq: Once | INTRAMUSCULAR | Status: DC | PRN

## 2019-11-15 MED ORDER — DIPHENHYDRAMINE HCL 50 MG/ML IJ SOLN: 50.0000 mg | Freq: Once | INTRAMUSCULAR | Status: DC | PRN

## 2019-11-15 MED ORDER — SODIUM CHLORIDE 0.9 % IV SOLN: INTRAVENOUS | Status: DC | PRN

## 2019-11-15 MED ORDER — SOTROVIMAB 500 MG/8ML IV SOLN
500.0000 mg | Freq: Once | INTRAVENOUS | Status: AC
  Administered 2019-11-15: 500 mg via INTRAVENOUS

## 2019-11-15 NOTE — Progress Notes (Signed)
I connected by phone with Jade Daniels on 11/15/2019 at 10:49 AM to discuss the potential use of a new treatment for mild to moderate COVID-19 viral infection in non-hospitalized patients.  This patient is a 49 y.o. female that meets the FDA criteria for Emergency Use Authorization of COVID monoclonal antibody casirivimab/imdevimab, bamlanivimab/eteseviamb, or sotrovimab.  Has a (+) direct SARS-CoV-2 viral test result  Has mild or moderate COVID-19   Is NOT hospitalized due to COVID-19  Is within 10 days of symptom onset  Has at least one of the high risk factor(s) for progression to severe COVID-19 and/or hospitalization as defined in EUA.  Specific high risk criteria : BMI > 25 and Cardiovascular disease or hypertension   I have spoken and communicated the following to the patient or parent/caregiver regarding COVID monoclonal antibody treatment:  1. FDA has authorized the emergency use for the treatment of mild to moderate COVID-19 in adults and pediatric patients with positive results of direct SARS-CoV-2 viral testing who are 103 years of age and older weighing at least 40 kg, and who are at high risk for progressing to severe COVID-19 and/or hospitalization.  2. The significant known and potential risks and benefits of COVID monoclonal antibody, and the extent to which such potential risks and benefits are unknown.  3. Information on available alternative treatments and the risks and benefits of those alternatives, including clinical trials.  4. Patients treated with COVID monoclonal antibody should continue to self-isolate and use infection control measures (e.g., wear mask, isolate, social distance, avoid sharing personal items, clean and disinfect "high touch" surfaces, and frequent handwashing) according to CDC guidelines.   5. The patient or parent/caregiver has the option to accept or refuse COVID monoclonal antibody treatment.  After reviewing this information with the  patient, the patient has agreed to receive one of the available covid 19 monoclonal antibodies and will be provided an appropriate fact sheet prior to infusion. Jobe Gibbon, NP 11/15/2019 10:49 AM

## 2019-11-15 NOTE — Progress Notes (Signed)
Patient ID: Jade Daniels, female   DOB: 1970-12-06, 49 y.o.   MRN: [id number]   Diagnosis: COVID-19  Physician: Ashby Dawes  Procedure: sotrovimab   Complications: No immediate complications noted.  Discharge: Discharged home   Jade Daniels 11/15/2019

## 2019-11-15 NOTE — Discharge Instructions (Signed)

## 2019-11-19 ENCOUNTER — Other Ambulatory Visit (HOSPITAL_COMMUNITY): Payer: Self-pay

## 2019-11-27 ENCOUNTER — Encounter: Payer: Self-pay | Admitting: Obstetrics & Gynecology

## 2020-02-22 ENCOUNTER — Ambulatory Visit: Payer: 59 | Admitting: Obstetrics & Gynecology

## 2020-02-28 ENCOUNTER — Ambulatory Visit (HOSPITAL_BASED_OUTPATIENT_CLINIC_OR_DEPARTMENT_OTHER): Payer: 59 | Admitting: Obstetrics & Gynecology

## 2020-03-05 ENCOUNTER — Ambulatory Visit (INDEPENDENT_AMBULATORY_CARE_PROVIDER_SITE_OTHER): Payer: 59 | Admitting: Obstetrics & Gynecology

## 2020-03-05 ENCOUNTER — Encounter (HOSPITAL_BASED_OUTPATIENT_CLINIC_OR_DEPARTMENT_OTHER): Payer: Self-pay | Admitting: Obstetrics & Gynecology

## 2020-03-05 ENCOUNTER — Other Ambulatory Visit: Payer: Self-pay

## 2020-03-05 VITALS — BP 134/71 | HR 70 | Ht 64.5 in | Wt 170.8 lb

## 2020-03-05 DIAGNOSIS — Z01419 Encounter for gynecological examination (general) (routine) without abnormal findings: Secondary | ICD-10-CM | POA: Diagnosis not present

## 2020-03-05 DIAGNOSIS — E063 Autoimmune thyroiditis: Secondary | ICD-10-CM | POA: Insufficient documentation

## 2020-03-05 DIAGNOSIS — I10 Essential (primary) hypertension: Secondary | ICD-10-CM

## 2020-03-05 DIAGNOSIS — E781 Pure hyperglyceridemia: Secondary | ICD-10-CM | POA: Insufficient documentation

## 2020-03-05 NOTE — Progress Notes (Signed)
50 y.o. G56P2002 Married   White or Caucasian Other or two or more races female here for annual exam.  Doing well.  Reports she had Covid in November and did ok with this.  She is seeing Dr. Margaretann Loveless, cardiology, due to some palpitations.  She has noticed a change.  She drinks very little caffeine.   She is seeing Margarita Mail, integrative therapy provider.  Has lots of blood work done.  She was diagnosed with insulin resistance.  Cholesterol was elevated.  She did keto for 3 months.  Reports follow up lab work showed glucose was better, hbA1c was better.     Denies vaginal bleeding.     Patient's last menstrual period was 10/13/2009.          Sexually active: Yes.    The current method of family planning is status post hysterectomy.    Exercising: Yes.    Smoker:  no  Health Maintenance: Pap:  Vaginal pap 06/2016 History of abnormal Pap:  no MMG:  09/2019 Colonoscopy:  Willing to do cologuard this year, guidelines reviewed BMD:   Not indicated TDaP:  declines Hep C testing: reviewed, does not have blood work with my office, typically Screening Labs: done with integrative provider   reports that she has never smoked. She has never used smokeless tobacco. She reports current alcohol use of about 1.0 standard drink of alcohol per week. She reports that she does not use drugs.  Past Medical History:  Diagnosis Date  . Fibromyalgia 2/13  . Hypertension   . 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 BREAST REDUCTION SURGERY  01/2015   . eyelid surgery    . HYSTEROSCOPY  04/2006   D&C   . LAPAROSCOPIC TOTAL HYSTERECTOMY  11/10/09   TLH  . LIPOSUCTION  01/2015    full torso   . LIPOSUCTION  05/21/2016  . OVARIAN CYST REMOVAL  2008    Current Outpatient Medications  Medication Sig Dispense Refill  . metoprolol succinate (TOPROL-XL) 25 MG 24 hr tablet TAKE 1 TABLET BY MOUTH DAILY. TAKE WITH OR  IMMEDIATELY FOLLOWING A MEAL. 90 tablet 1   No current facility-administered medications for this visit.    Family History  Problem Relation Age of Onset  . Hypertension Father   . Thyroid disease Father   . Diabetes Father   . Kidney failure Father   . Diabetes Mother   . Leukemia Mother     Review of Systems  All other systems reviewed and are negative.   Exam:   BP 134/71   Pulse 70   Ht 5' 4.5" (1.638 m)   Wt 170 lb 12.8 oz (77.5 kg)   LMP 10/13/2009   BMI 28.86 kg/m   Height: 5' 4.5" (163.8 cm)  General appearance: alert, cooperative and appears stated age Head: Normocephalic, without obvious abnormality, atraumatic Neck: no adenopathy, supple, symmetrical, trachea midline and thyroid normal to inspection and palpation Lungs: clear to auscultation bilaterally Breasts: normal appearance, no masses or tenderness Heart: regular rate and rhythm Abdomen: soft, non-tender; bowel sounds normal; no masses,  no organomegaly Extremities: extremities normal, atraumatic, no cyanosis or edema Skin: Skin color, texture, turgor normal. No rashes or lesions Lymph nodes: Cervical, supraclavicular, and axillary nodes normal. No abnormal inguinal nodes palpated Neurologic: Grossly normal  Pelvic: External genitalia:  no lesions              Urethra:  normal appearing urethra with no masses, tenderness or lesions              Bartholins and Skenes: normal                 Vagina: normal appearing vagina with normal color and discharge, no lesions              Cervix: absent              Pap taken: No. Bimanual Exam:  Uterus:  uterus absent              Adnexa: normal adnexa and no mass, fullness, tenderness               Rectovaginal: Confirms               Anus:  normal sphincter tone, no lesions  Chaperone was present for exam.  Assessment/Plan: 1. Well woman exam with routine gynecological exam - pap smear not indicated - MMG 09/2019 - Cologuard order will be placed - she  will send me copies of blood work - vaccines reviewed  2. Primary hypertension - seeing Dr. Margaretann Loveless - on metoprolol   3. Hashimoto's thyroiditis - not on any medication for this

## 2020-03-12 ENCOUNTER — Other Ambulatory Visit (HOSPITAL_BASED_OUTPATIENT_CLINIC_OR_DEPARTMENT_OTHER): Payer: Self-pay | Admitting: Obstetrics & Gynecology

## 2020-03-12 DIAGNOSIS — Z1211 Encounter for screening for malignant neoplasm of colon: Secondary | ICD-10-CM

## 2020-03-18 ENCOUNTER — Ambulatory Visit: Payer: 59 | Admitting: Internal Medicine

## 2020-03-18 ENCOUNTER — Other Ambulatory Visit: Payer: Self-pay

## 2020-03-18 ENCOUNTER — Encounter: Payer: Self-pay | Admitting: Internal Medicine

## 2020-03-18 VITALS — BP 112/78 | HR 70 | Ht 64.5 in | Wt 170.0 lb

## 2020-03-18 DIAGNOSIS — R079 Chest pain, unspecified: Secondary | ICD-10-CM | POA: Diagnosis not present

## 2020-03-18 DIAGNOSIS — R002 Palpitations: Secondary | ICD-10-CM | POA: Diagnosis not present

## 2020-03-18 DIAGNOSIS — E781 Pure hyperglyceridemia: Secondary | ICD-10-CM

## 2020-03-18 DIAGNOSIS — I10 Essential (primary) hypertension: Secondary | ICD-10-CM

## 2020-03-18 DIAGNOSIS — E782 Mixed hyperlipidemia: Secondary | ICD-10-CM | POA: Diagnosis not present

## 2020-03-18 NOTE — Patient Instructions (Signed)
Medication Instructions:  No Changes In Medications at this time.  *If you need a refill on your cardiac medications before your next appointment, please call your pharmacy*  Testing/Procedures: Dr. Margaretann Loveless  has ordered a CT coronary calcium score. This test is done at [address]. This is $99 out of pocket.  Coronary CalciumScan A coronary calcium scan is an imaging test used to look for deposits of calcium and other fatty materials (plaques) in the inner lining of the blood vessels of the heart (coronary arteries). These deposits of calcium and plaques can partly clog and narrow the coronary arteries without producing any symptoms or warning signs. This puts a person at risk for a heart attack. This test can detect these deposits before symptoms develop. Tell a health care provider about:  Any allergies you have.  All medicines you are taking, including vitamins, herbs, eye drops, creams, and over-the-counter medicines.  Any problems you or family members have had with anesthetic medicines.  Any blood disorders you have.  Any surgeries you have had.  Any medical conditions you have.  Whether you are pregnant or may be pregnant. What are the risks? Generally, this is a safe procedure. However, problems may occur, including:  Harm to a pregnant woman and her unborn baby. This test involves the use of radiation. Radiation exposure can be dangerous to a pregnant woman and her unborn baby. If you are pregnant, you generally should not have this procedure done.  Slight increase in the risk of cancer. This is because of the radiation involved in the test. What happens before the procedure? No preparation is needed for this procedure. What happens during the procedure?  You will undress and remove any jewelry around your neck or chest.  You will put on a hospital gown.  Sticky electrodes will be placed on your chest. The electrodes will be connected to an  electrocardiogram (ECG) machine to record a tracing of the electrical activity of your heart.  A CT scanner will take pictures of your heart. During this time, you will be asked to lie still and hold your breath for 2-3 seconds while a picture of your heart is being taken. The procedure may vary among health care providers and hospitals. What happens after the procedure?  You can get dressed.  You can return to your normal activities.  It is up to you to get the results of your test. Ask your health care provider, or the department that is doing the test, when your results will be ready. Summary  A coronary calcium scan is an imaging test used to look for deposits of calcium and other fatty materials (plaques) in the inner lining of the blood vessels of the heart (coronary arteries).  Generally, this is a safe procedure. Tell your health care provider if you are pregnant or may be pregnant.  No preparation is needed for this procedure.  A CT scanner will take pictures of your heart.  You can return to your normal activities after the scan is done. This information is not intended to replace advice given to you by your health care provider. Make sure you discuss any questions you have with your health care provider. Document Released: 06/26/2007 Document Revised: 11/17/2015 Document Reviewed: 11/17/2015 Elsevier Interactive Patient Education  2017 [place]: At [facility], you and your health needs are our priority.  As part of our continuing mission to provide you with exceptional heart care, we have created  designated Provider Care Teams.  These Care Teams include your primary Cardiologist (physician) and Advanced Practice Providers (APPs -  Physician Assistants and Nurse Practitioners) who all work together to provide you with the care you need, when you need it.  Your next appointment:   6 month(s)  The format for your next appointment:   In Person  Provider:    Cherlynn Kaiser, MD  Other Instructions IF YOUR PALPITATIONS GET WORSE PLEASE CALL us [phone]) SO THAT WE CAN SCHEDULE/ORDER HEART MONITOR AND ECHOCARDIOGRAM

## 2020-03-18 NOTE — Progress Notes (Signed)
Cardiology Office Note:    Date:  03/18/2020   ID:  Jade Daniels, DOB Nov 07, 1970, MRN [id number]  PCP:  Merrilee Seashore, MD  Cardiologist:  No primary care provider on file.  Electrophysiologist:  None   Referring MD: Merrilee Seashore, MD   Chief Complaint/Reason for Referral: Palpitations, HLD  History of Present Illness:    Jade Daniels is a 50 y.o. female with a history of hypertension, fibromyalgia, family history of coronary artery disease and recent evaluation for chest pain.  She is a non-smoker.   She was seen by [facility] cardiovascular in November 2020.  She had a low risk stress test performed 11/29/2018.  She was started on metoprolol for angina and hypertension, but experienced dizziness and her dose was reduced to 12.5 mg twice daily with improvement in symptoms.  She was originally intended to have a coronary CTA but reported an allergy to contrast dye and this was deferred in favor of a nuclear stress test.   She has described palpitations in the past and belching after eating. She describes this as a flip-flopping in her chest.  On interview today she does not recall having palpitations in the past and feels that after having Covid in November palpitations have worsened.  She did require monoclonal antibodies for her Covid infection at that time.  She continues on metoprolol succinate 25 mg daily but notes palpitations primarily after eating.  She notes that she was eating a low-carb wrap and for 2 hours afterward would have belching and palpitations.  She has not had this food item in 1 week and will monitor how she feels.  She is also being tested for SIBO by her integrative practitioner due to belching.  She does occasionally have chest pain with episodes of stress.  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  Recall she has a low risk stress test from November 2020.  She is trying  to manage her hyperlipidemia with diet and exercise.  LDL 133, triglycerides 196, HDL 53, VLDL 35.  She is not inclined to start statin therapy just yet and would like to try continue diet modification.  She is currently following a keto diet and we discussed that this high-fat diet can contribute to hyperlipidemia.  She plans to transition from red meats to lean proteins to see if this will help.  Past Medical History:  Diagnosis Date  . Fibromyalgia 2/13  . Hypertension   . 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 BREAST REDUCTION SURGERY  01/2015   . eyelid surgery    . HYSTEROSCOPY  04/2006   D&C   . LAPAROSCOPIC TOTAL HYSTERECTOMY  11/10/09   TLH  . LIPOSUCTION  01/2015    full torso   . LIPOSUCTION  05/21/2016  . lower face lift  04/2019  . OVARIAN CYST REMOVAL  2008    Current Medications: Current Meds  Medication Sig  . metoprolol succinate (TOPROL-XL) 25 MG 24 hr tablet TAKE 1 TABLET BY MOUTH DAILY. TAKE WITH OR IMMEDIATELY FOLLOWING A MEAL.     Allergies:   Azithromycin, Bactrim [sulfamethoxazole-trimethoprim], Erythromycin base, Lidocaine, Codeine, Ergocalciferol, and Synthroid [levothyroxine sodium]   Social History   Tobacco Use  . Smoking status: Never Smoker  . Smokeless tobacco: Never Used  Vaping Use  . Vaping Use: Never used  Substance Use Topics  .  Alcohol use: Yes    Alcohol/week: 1.0 standard drink    Types: 1 Glasses of wine per week    Comment: wine once/twice month  . Drug use: No     Family History: The patient's family history includes Diabetes in her father and mother; Hypertension in her father; Kidney failure in her father; Leukemia in her mother; Thyroid disease in her father.  ROS:   Please see the history of present illness.    All other systems reviewed and are negative.  EKGs/Labs/Other Studies Reviewed:    The following studies were  reviewed today:  EKG:  NSR  Recent Labs: No results found for requested labs within last 8760 hours.  Recent Lipid Panel    Component Value Date/Time   CHOL 196 11/17/2017 0831   TRIG 192 (H) 11/17/2017 0831   HDL 43 11/17/2017 0831   CHOLHDL 4.6 (H) 11/17/2017 0831   LDLCALC 115 (H) 11/17/2017 0831    Physical Exam:    VS:  BP 112/78 (BP Location: Right Arm, Patient Position: Sitting, Cuff Size: Normal)   Pulse 70   Ht 5' 4.5" (1.638 m)   Wt 170 lb (77.1 kg)   LMP 10/13/2009   BMI 28.73 kg/m     Wt Readings from Last 5 Encounters:  03/18/20 170 lb (77.1 kg)  03/05/20 170 lb 12.8 oz (77.5 kg)  09/07/19 176 lb (79.8 kg)  12/06/18 176 lb (79.8 kg)  11/28/18 172 lb 6.4 oz (78.2 kg)    Constitutional: No acute distress Eyes: sclera non-icteric, normal conjunctiva and lids ENMT: normal dentition, moist mucous membranes Cardiovascular: regular rhythm, normal rate, no murmurs. S1 and S2 normal. Radial pulses normal bilaterally. No jugular venous distention.  Respiratory: clear to auscultation bilaterally GI : normal bowel sounds, soft and nontender. No distention.   MSK: extremities warm, well perfused. No edema.  NEURO: grossly nonfocal exam, moves all extremities. PSYCH: alert and oriented x 3, normal mood and affect.   ASSESSMENT:    1. Hypertriglyceridemia   2. Mixed hyperlipidemia   3. Chest pain, unspecified type   4. Palpitations   5. Essential hypertension    PLAN:    Palpitations-worsened after Covid.  Continues on metoprolol succinate 25 mg daily.  She would like to see if treatment and evaluation for SIBO improves her symptoms of palpitations.  If they worsen, we discussed obtaining a 2-week ZIO monitor and echocardiogram for evaluation of palpitations.  Hyperlipidemia-she would like to attempt diet lifestyle modification for another 6 months.  Will have repeat lipids in 6 months with her PCP and will obtain a calcium score now to risk stratify  further.  Chest discomfort-nonischemic nuclear study in November 2020.  Allergic to contrast dye so cannot perform CCTA.  She feels this is stress related.  Improved with metoprolol.  We will continue to observe at this time but she is to call me or present to the ER with worsening chest pain.  Hypertension-continue metoprolol succinate, blood pressure well controlled.   Total time of encounter: 34 minutes total time of encounter, including 30 minutes spent in face-to-face patient care on the date of this encounter. This time includes coordination of care and counseling regarding above mentioned problem list. Remainder of non-face-to-face time involved reviewing chart documents/testing relevant to the patient encounter and documentation in the medical record. I have independently reviewed documentation from referring provider.   Cherlynn Kaiser, MD, [facility]    Medication Adjustments/Labs and Tests Ordered: Current  medicines are reviewed at length with the patient today.  Concerns regarding medicines are outlined above.   Orders Placed This Encounter  Procedures  . CT CARDIAC SCORING (SELF PAY ONLY)  . EKG 12-Lead    No orders of the defined types were placed in this encounter.   Patient Instructions  Medication Instructions:  No Changes In Medications at this time.  *If you need a refill on your cardiac medications before your next appointment, please call your pharmacy*  Testing/Procedures: Dr. Margaretann Loveless  has ordered a CT coronary calcium score. This test is done at [address]. This is $99 out of pocket.  Coronary CalciumScan A coronary calcium scan is an imaging test used to look for deposits of calcium and other fatty materials (plaques) in the inner lining of the blood vessels of the heart (coronary arteries). These deposits of calcium and plaques can partly clog and narrow the coronary arteries without producing any symptoms or  warning signs. This puts a person at risk for a heart attack. This test can detect these deposits before symptoms develop. Tell a health care provider about:  Any allergies you have.  All medicines you are taking, including vitamins, herbs, eye drops, creams, and over-the-counter medicines.  Any problems you or family members have had with anesthetic medicines.  Any blood disorders you have.  Any surgeries you have had.  Any medical conditions you have.  Whether you are pregnant or may be pregnant. What are the risks? Generally, this is a safe procedure. However, problems may occur, including:  Harm to a pregnant woman and her unborn baby. This test involves the use of radiation. Radiation exposure can be dangerous to a pregnant woman and her unborn baby. If you are pregnant, you generally should not have this procedure done.  Slight increase in the risk of cancer. This is because of the radiation involved in the test. What happens before the procedure? No preparation is needed for this procedure. What happens during the procedure?  You will undress and remove any jewelry around your neck or chest.  You will put on a hospital gown.  Sticky electrodes will be placed on your chest. The electrodes will be connected to an electrocardiogram (ECG) machine to record a tracing of the electrical activity of your heart.  A CT scanner will take pictures of your heart. During this time, you will be asked to lie still and hold your breath for 2-3 seconds while a picture of your heart is being taken. The procedure may vary among health care providers and hospitals. What happens after the procedure?  You can get dressed.  You can return to your normal activities.  It is up to you to get the results of your test. Ask your health care provider, or the department that is doing the test, when your results will be ready. Summary  A coronary calcium scan is an imaging test used to look for  deposits of calcium and other fatty materials (plaques) in the inner lining of the blood vessels of the heart (coronary arteries).  Generally, this is a safe procedure. Tell your health care provider if you are pregnant or may be pregnant.  No preparation is needed for this procedure.  A CT scanner will take pictures of your heart.  You can return to your normal activities after the scan is done. This information is not intended to replace advice given to you by your health care provider. Make sure you discuss  any questions you have with your health care provider. Document Released: 06/26/2007 Document Revised: 11/17/2015 Document Reviewed: 11/17/2015 Elsevier Interactive Patient Education  2017 [place]: At [facility], you and your health needs are our priority.  As part of our continuing mission to provide you with exceptional heart care, we have created designated Provider Care Teams.  These Care Teams include your primary Cardiologist (physician) and Advanced Practice Providers (APPs -  Physician Assistants and Nurse Practitioners) who all work together to provide you with the care you need, when you need it.  Your next appointment:   6 month(s)  The format for your next appointment:   In Person  Provider:   Cherlynn Kaiser, MD  Other Instructions IF YOUR PALPITATIONS GET WORSE PLEASE CALL us [phone]) SO THAT WE CAN SCHEDULE/ORDER HEART MONITOR AND ECHOCARDIOGRAM

## 2020-04-01 LAB — COLOGUARD
COLOGUARD: NEGATIVE
Cologuard: NEGATIVE

## 2020-04-01 LAB — EXTERNAL GENERIC LAB PROCEDURE: COLOGUARD: NEGATIVE

## 2020-04-07 ENCOUNTER — Other Ambulatory Visit: Payer: 59

## 2020-04-14 ENCOUNTER — Ambulatory Visit (INDEPENDENT_AMBULATORY_CARE_PROVIDER_SITE_OTHER)
Admission: RE | Admit: 2020-04-14 | Discharge: 2020-04-14 | Disposition: A | Discharge status: Home / Self Care | Payer: Self-pay | Source: Ambulatory Visit | Attending: Internal Medicine | Admitting: Internal Medicine

## 2020-04-14 ENCOUNTER — Other Ambulatory Visit: Payer: Self-pay

## 2020-04-14 DIAGNOSIS — R079 Chest pain, unspecified: Secondary | ICD-10-CM

## 2020-04-24 ENCOUNTER — Other Ambulatory Visit: Payer: Self-pay | Admitting: Internal Medicine

## 2020-04-24 DIAGNOSIS — I10 Essential (primary) hypertension: Secondary | ICD-10-CM

## 2020-04-24 DIAGNOSIS — R002 Palpitations: Secondary | ICD-10-CM

## 2020-04-28 ENCOUNTER — Telehealth: Payer: Self-pay | Admitting: Internal Medicine

## 2020-04-28 DIAGNOSIS — I10 Essential (primary) hypertension: Secondary | ICD-10-CM

## 2020-04-28 DIAGNOSIS — R002 Palpitations: Secondary | ICD-10-CM

## 2020-04-28 MED ORDER — METOPROLOL SUCCINATE ER 25 MG PO TB24: ORAL_TABLET | ORAL | 1 refills | Status: DC

## 2020-04-28 NOTE — Telephone Encounter (Signed)
Called the patient and informed her that I sent a a Rx for Metoprolol Succinate (Toprol-XL) 25 mg daily to her [facility] as a 90 supply with 1 refill. She verbalized understanding and no questions were asked.

## 2020-04-28 NOTE — Telephone Encounter (Signed)
*  STAT* If patient is at the pharmacy, call can be transferred to refill team.   1. Which medications need to be refilled? (please list name of each medication and dose if known) metoprolol succinate (TOPROL-XL) 25 MG 24 hr tablet  2. Which pharmacy/location (including street and city if local pharmacy) is medication to be sent to? [place], [place] [address]  3. Do they need a 30 day or 90 day supply? 90 day supply

## 2020-06-17 ENCOUNTER — Telehealth: Payer: Self-pay | Admitting: Internal Medicine

## 2020-06-17 ENCOUNTER — Ambulatory Visit (INDEPENDENT_AMBULATORY_CARE_PROVIDER_SITE_OTHER): Payer: 59

## 2020-06-17 DIAGNOSIS — R002 Palpitations: Secondary | ICD-10-CM

## 2020-06-17 NOTE — Telephone Encounter (Signed)
Per Dr. Margaretann Loveless- okay to proceed with testing, Echocardiogram and 2 Week Zio Monitor.   Returned call to patient who states that she is currently having palpitations that has lasted for 3 hours. Per Dr. Margaretann Loveless patient can take an extra Metoprolol Succinate 71m Daily as needed for palpitations for a total of 599mdaily. Patient unable to check current heart rate or BP.   Patient states that she is also burping with these palpitations and is unsure if its related or not. Patient states that she also had acupuncture and cupping and afterwards is when she felt the palpitations occur. Patient denies any other triggers but states that she may not be well hydrated. Educated patient on triggers for palpitations and importance of adequate hydration.   Advised patient that I would forward message to scheduling to get Echo scheduled as well. Advised patient to call back to office with any issues. Patient verbalized understanding of all instructions.   Orders placed.

## 2020-06-17 NOTE — Progress Notes (Unsigned)
Patient enrolled for [employer] to mail a 14 day ZIO XT monitor to address on file.

## 2020-06-17 NOTE — Telephone Encounter (Signed)
Returned call to patient of Dr. Margaretann Loveless She reports she told MD of palpitations previously at last visit - came with strong urges to burp Episodes lasted so long, they would wear her out, episodes come and go The symptoms had resolved before her March 2022 visit but she said MD told her to contact us if they came back  Symptoms started again Friday evening and have happened every day since She had two episodes yesterday and was so exhausted after them  Patient is taking metoprolol succinate She does not take BP/pulse routinely  She plans to check this evening at home   Per last MD note: If they worsen, we discussed obtaining a 2-week ZIO monitor and echocardiogram for evaluation of palpitations.   Orders placed, pending MD verbal to proceed as outlined in note. Advised patient would follow up on MD plan/scheduling testing if recommended

## 2020-06-17 NOTE — Telephone Encounter (Signed)
Patient c/o Palpitations:  High priority if patient c/o lightheadedness, shortness of breath, or chest pain  1) How long have you had palpitations/irregular HR/ Afib? Are you having the symptoms now? 4 days patient is not having them right now  2) Are you currently experiencing lightheadedness, SOB or CP?  No   3) Do you have a history of afib (atrial fibrillation) or irregular heart rhythm? No   4) Have you checked your BP or HR? (document readings if available): no   5) Are you experiencing any other symptoms? Patient gets very tired

## 2020-06-19 NOTE — Telephone Encounter (Signed)
Echo is scheduled for 6/29.

## 2020-06-21 DIAGNOSIS — R002 Palpitations: Secondary | ICD-10-CM | POA: Diagnosis not present

## 2020-07-09 ENCOUNTER — Ambulatory Visit (HOSPITAL_COMMUNITY): Payer: 59 | Attending: Internal Medicine

## 2020-07-09 ENCOUNTER — Other Ambulatory Visit: Payer: Self-pay

## 2020-07-09 DIAGNOSIS — R002 Palpitations: Secondary | ICD-10-CM | POA: Insufficient documentation

## 2020-07-09 DIAGNOSIS — R079 Chest pain, unspecified: Secondary | ICD-10-CM

## 2020-07-09 LAB — ECHOCARDIOGRAM COMPLETE
Area-P 1/2: 3.56 cm2
S' Lateral: 2.7 cm

## 2020-09-26 ENCOUNTER — Encounter: Payer: Self-pay | Admitting: Internal Medicine

## 2020-09-26 ENCOUNTER — Ambulatory Visit: Payer: 59 | Admitting: Internal Medicine

## 2020-09-26 ENCOUNTER — Other Ambulatory Visit: Payer: Self-pay

## 2020-09-26 VITALS — BP 110/76 | HR 67 | Resp 20 | Ht 64.0 in | Wt 174.4 lb

## 2020-09-26 DIAGNOSIS — E781 Pure hyperglyceridemia: Secondary | ICD-10-CM | POA: Diagnosis not present

## 2020-09-26 DIAGNOSIS — E782 Mixed hyperlipidemia: Secondary | ICD-10-CM

## 2020-09-26 DIAGNOSIS — R079 Chest pain, unspecified: Secondary | ICD-10-CM

## 2020-09-26 DIAGNOSIS — R002 Palpitations: Secondary | ICD-10-CM

## 2020-09-26 DIAGNOSIS — I10 Essential (primary) hypertension: Secondary | ICD-10-CM | POA: Diagnosis not present

## 2020-09-26 NOTE — Progress Notes (Signed)
Cardiology Office Note:    Date:  09/26/2020   ID:  Jade Daniels, DOB October 25, 1970, MRN [id number]  PCP:  Merrilee Seashore, MD  Cardiologist:  None  Electrophysiologist:  None   Referring MD: Merrilee Seashore, MD   Chief Complaint/Reason for Referral: Palpitations, HLD  History of Present Illness:    Jade Daniels is a 50 y.o. female with a history of hypertension, fibromyalgia, family history of coronary artery disease and recent evaluation for chest pain.  She is a non-smoker.  09/26/20: Symptomatically feeling well.  Made some dietary adjustments from a GI perspective and has not had recurrence of worrisome palpitations.  We reviewed results of coronary calcium score, echocardiogram, cardiac monitor, and last lipid panel available to me from 2021.  Discussed low ASCVD risk with 0 calcium score and focus on diet and lifestyle modification for optimization of lipids which are currently above goal.  Palpitations and blood pressure both well controlled on metoprolol succinate 25 mg daily.  Her father is currently stage V renal disease not pursuing dialysis and her mother has leukemia.  This adds a significant amount of stress to her day.  She is managing this by trying to meditate.  We discussed options for yoga and other classes that may be beneficial for stress management.  The patient denies chest pain, chest pressure, dyspnea at rest or with exertion, palpitations, PND, orthopnea, or leg swelling. Denies cough, fever, chills. Denies nausea, vomiting. Denies syncope or presyncope. Denies dizziness or lightheadedness.    03/18/20: She was seen by [facility] cardiovascular in November 2020.  She had a low risk stress test performed 11/29/2018.  She was started on metoprolol for angina and hypertension, but experienced dizziness and her dose was reduced to 12.5 mg twice daily with improvement in symptoms.  She was originally intended to have a coronary CTA but reported an allergy to contrast  dye and this was deferred in favor of a nuclear stress test.   She has described palpitations in the past and belching after eating. She describes this as a flip-flopping in her chest.  On interview today she does not recall having palpitations in the past and feels that after having Covid in November palpitations have worsened.  She did require monoclonal antibodies for her Covid infection at that time.  She continues on metoprolol succinate 25 mg daily but notes palpitations primarily after eating.  She notes that she was eating a low-carb wrap and for 2 hours afterward would have belching and palpitations.  She has not had this food item in 1 week and will monitor how she feels.  She is also being tested for SIBO by her integrative practitioner due to belching.  She does occasionally have chest pain with episodes of stress.  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  Recall she has a low risk stress test from November 2020.  She is trying to manage her hyperlipidemia with diet and exercise.  LDL 133, triglycerides 196, HDL 53, VLDL 35.  She is not inclined to start statin therapy just yet and would like to try continue diet modification.  She is currently following a keto diet and we discussed that this high-fat diet can contribute to hyperlipidemia.  She plans to transition from red meats to lean proteins to see if this will help.  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 TAKE WITH OR IMMEDIATELY FOLLOWING A MEAL.     Allergies:   Azithromycin, Bactrim [sulfamethoxazole-trimethoprim], Erythromycin base, Lidocaine, Codeine, Ergocalciferol, and Synthroid [levothyroxine sodium]   Social History   Tobacco Use   Smoking status: Never   Smokeless tobacco: Never  Vaping Use   Vaping Use: Never used  Substance Use Topics   Alcohol use: Yes    Alcohol/week: 1.0 standard drink    Types: 1 Glasses of wine per week    Comment: wine once/twice month   Drug use: No     Family History: The patient's family history includes Diabetes in her father and mother; Hypertension in her father; Kidney failure in her father; Leukemia in her mother; Thyroid disease in her father.  ROS:   Please see the history of present illness.    All other systems reviewed and are negative.  EKGs/Labs/Other Studies Reviewed:    The following studies were reviewed today:  EKG:  NSR, unchanged.  Recent Labs: No results found for requested labs within last 8760 hours.  Recent Lipid Panel    Component Value Date/Time   CHOL 196 11/17/2017 0831   TRIG 192 (H) 11/17/2017 0831   HDL 43 11/17/2017 0831   CHOLHDL 4.6 (H) 11/17/2017 0831   LDLCALC 115 (H) 11/17/2017 0831    Physical Exam:    VS:  BP 110/76 (BP Location: Left Arm, Patient Position: Sitting, Cuff Size: Normal)   Pulse 67   Resp 20   Ht 5' 4"  (1.626 m)   Wt 174 lb 6.4 oz (79.1 kg)   LMP 10/13/2009   SpO2 95%   BMI 29.94 kg/m     Wt Readings from Last 5 Encounters:  09/26/20 174 lb 6.4 oz (79.1 kg)  03/18/20 170 lb (77.1 kg)  03/05/20 170 lb 12.8 oz (77.5 kg)  09/07/19 176 lb (79.8 kg)  12/06/18 176 lb (79.8 kg)    Constitutional: No acute distress Eyes: sclera non-icteric, normal conjunctiva and lids ENMT: normal dentition, moist mucous membranes Cardiovascular: regular rhythm,  normal rate, no murmurs. S1 and S2 normal. Radial pulses normal bilaterally. No jugular venous distention.  Respiratory: clear to auscultation bilaterally GI : normal bowel sounds, soft and nontender. No distention.   MSK: extremities warm, well perfused. No edema.  NEURO: grossly nonfocal exam, moves all extremities. PSYCH: alert and oriented x 3, normal mood and affect.     ASSESSMENT:    1. Palpitations   2. Essential hypertension   3. Hypertriglyceridemia   4. Mixed hyperlipidemia   5. Chest pain, unspecified type     PLAN:    Palpitations-improved after dietary modification and continued use of metoprolol succinate.  Continue metoprolol succinate 25 mg daily.  Hyperlipidemia-with 0 calcium score, we discussed continued diet and lifestyle modification for the management of hyperlipidemia.  LDL is above goal of less than 70 and triglycerides are elevated  above goal of less than 150.  She will make further changes and has lab work pending today.  Chest discomfort-nonischemic nuclear study in November 2020.  Allergic to contrast dye so cannot perform CCTA.  She feels this is stress related.  Improved with metoprolol.  We will continue to observe at this time but she is to call me or present to the ER with worsening chest pain.  Since last visit, no recurrence.  Hypertension-continue metoprolol succinate, blood pressure well controlled.   Total time of encounter: 40 minutes total time of encounter, including 24 minutes spent in face-to-face patient care on the date of this encounter. This time includes coordination of care and counseling regarding above mentioned problem list. Remainder of non-face-to-face time involved reviewing chart documents/testing relevant to the patient encounter and documentation in the medical record. I have independently reviewed documentation from referring provider.   Cherlynn Kaiser, MD, [facility]     Medication  Adjustments/Labs and Tests Ordered: Current medicines are reviewed at length with the patient today.  Concerns regarding medicines are outlined above.   Orders Placed This Encounter  Procedures   EKG 12-Lead     No orders of the defined types were placed in this encounter.   Patient Instructions  Medication Instructions:  No Changes In Medications at this time.  *If you need a refill on your cardiac medications before your next appointment, please call your pharmacy*  Follow-Up: At [facility], you and your health needs are our priority.  As part of our continuing mission to provide you with exceptional heart care, we have created designated Provider Care Teams.  These Care Teams include your primary Cardiologist (physician) and Advanced Practice Providers (APPs -  Physician Assistants and Nurse Practitioners) who all work together to provide you with the care you need, when you need it.   Your next appointment:   1 year(s)  The format for your next appointment:   In Person  Provider:   Cherlynn Kaiser, MD

## 2020-09-26 NOTE — Patient Instructions (Signed)
Medication Instructions:  No Changes In Medications at this time.  *If you need a refill on your cardiac medications before your next appointment, please call your pharmacy*  Follow-Up: At [facility], you and your health needs are our priority.  As part of our continuing mission to provide you with exceptional heart care, we have created designated Provider Care Teams.  These Care Teams include your primary Cardiologist (physician) and Advanced Practice Providers (APPs -  Physician Assistants and Nurse Practitioners) who all work together to provide you with the care you need, when you need it.   Your next appointment:   1 year(s)  The format for your next appointment:   In Person  Provider:   Cherlynn Kaiser, MD

## 2020-10-16 ENCOUNTER — Encounter (HOSPITAL_BASED_OUTPATIENT_CLINIC_OR_DEPARTMENT_OTHER): Payer: Self-pay | Admitting: Obstetrics & Gynecology

## 2020-10-20 ENCOUNTER — Other Ambulatory Visit: Payer: Self-pay | Admitting: Internal Medicine

## 2020-10-20 DIAGNOSIS — R002 Palpitations: Secondary | ICD-10-CM

## 2020-10-20 DIAGNOSIS — I10 Essential (primary) hypertension: Secondary | ICD-10-CM

## 2020-10-24 NOTE — Telephone Encounter (Addendum)
*  STAT* If patient is at the pharmacy, call can be transferred to refill team.   1. Which medications need to be refilled? (please list name of each medication and dose if known) metoprolol succinate (TOPROL-XL) 25 MG 24 hr tablet  2. Which pharmacy/location (including street and city if local pharmacy) is medication to be sent to? [place], [address]  3. Do they need a 30 day or 90 day supply? 90 day   URGENT- PATIENT ONLY HAS 1 PILL LEFT

## 2020-11-26 ENCOUNTER — Telehealth: Payer: Self-pay | Admitting: Internal Medicine

## 2020-11-26 NOTE — Telephone Encounter (Signed)
Spoke with patient regarding her blood pressure concerns. For the past 3 weeks, BP readings ranged from 118/92 to 148/84 with pulse in the 60's. Does report frequent dizziness and feeling light-headed. Her fasting glucose at home ranges 106-110. She takes metoprolol succinate 25 mg in the mornings after breakfast. She wanted to know what her plan of care should be.

## 2020-11-26 NOTE — Telephone Encounter (Signed)
Pt c/o BP issue: STAT if pt c/o blurred vision, one-sided weakness or slurred speech  1. What are your last 5 BP readings?  Patient states that she her bp runs between 143-148/70-92  2. Are you having any other symptoms (ex. Dizziness, headache, blurred vision, passed out)? Dizziness, blurred vision  3. What is your BP issue? Its high  Tried calling triage no one answer

## 2020-11-27 NOTE — Telephone Encounter (Signed)
Jade Munroe, MD  You 22 hours ago (2:57 PM)   This is a new issue - I would have her come in for a visit.  GA    Patient would like to be seen sooner due to issues, scheduled patient to see Laurann Montana, NP tomorrow at the [facility] location. Patient is aware of appointment time and date.

## 2020-11-28 ENCOUNTER — Other Ambulatory Visit: Payer: Self-pay

## 2020-11-28 ENCOUNTER — Ambulatory Visit (HOSPITAL_BASED_OUTPATIENT_CLINIC_OR_DEPARTMENT_OTHER): Payer: 59 | Admitting: Family

## 2020-11-28 ENCOUNTER — Encounter (HOSPITAL_BASED_OUTPATIENT_CLINIC_OR_DEPARTMENT_OTHER): Payer: Self-pay | Admitting: Family

## 2020-11-28 VITALS — BP 116/80 | HR 63 | Ht 64.0 in | Wt 177.5 lb

## 2020-11-28 DIAGNOSIS — I10 Essential (primary) hypertension: Secondary | ICD-10-CM

## 2020-11-28 DIAGNOSIS — R42 Dizziness and giddiness: Secondary | ICD-10-CM

## 2020-11-28 DIAGNOSIS — R002 Palpitations: Secondary | ICD-10-CM | POA: Diagnosis not present

## 2020-11-28 DIAGNOSIS — I493 Ventricular premature depolarization: Secondary | ICD-10-CM | POA: Diagnosis not present

## 2020-11-28 MED ORDER — METOPROLOL SUCCINATE ER 25 MG PO TB24: 12.5000 mg | ORAL_TABLET | Freq: Every day | ORAL | 3 refills | Status: DC

## 2020-11-28 NOTE — Progress Notes (Signed)
Office Visit    Patient Name: Jade Daniels Date of Encounter: 11/28/2020  PCP:  Jade Daniels, Section  Cardiologist:  Jade Munroe, MD  Advanced Practice Provider:  No care team member to display Electrophysiologist:  None      Chief Complaint    Jade Daniels is a 50 y.o. female with a hx of hypertension, fibromyalgia, chest pain, palpitations presents today for palpitations   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   Past Surgical History:  Procedure Laterality Date   BREAST REDUCTION SURGERY  01/2015    eyelid surgery     HYSTEROSCOPY  04/2006   D&C    LAPAROSCOPIC TOTAL HYSTERECTOMY  11/10/09   TLH   LIPOSUCTION  01/2015    full torso    LIPOSUCTION  05/21/2016   lower face lift  04/2019   OVARIAN CYST REMOVAL  2008    Allergies  Allergies  Allergen Reactions   Azithromycin Other (See Comments)    GI Upset Severe   Bactrim [Sulfamethoxazole-Trimethoprim] Itching and Other (See Comments)    Numbness and tingling in fingers   Erythromycin Base    Lidocaine    Codeine Rash   Ergocalciferol Palpitations and Other (See Comments)    Intolerance to 50,000 IU, can take lower dosage of OTC vit D    Synthroid [Levothyroxine Sodium] Rash    History of Present Illness    Jade Daniels is a 50 y.o. female with a hx of hypertension, fibromyalgia, chest pain, palpitations  last seen 09/2020 by Dr. Margaretann Daniels.  She was seen at [facility] cardiovascular November 2020.  She had low risk stress test 11/29/2018.  She was started on metoprolol for angina hypertension but experienced dizziness and dose was reduced to 12.5 mg twice daily with improvement in symptoms.  She was originally intended for coronary CTA but reported contrast dye allergy and this was deferred in favor of nuclear stress  test.  She was subsequently evaluated by Dr. Margaretann Daniels.  CT cardiac scoring 04/2020 with coronary calcium score of 0.  Long-term monitor 07/10/20 with primarily normal sinus rhythm average heart rate 75 bpm, 2 runs of SVT with fastest 5 beats at rate 1:46 PM, and PVC with burden of 1.9%.  Echocardiogram 629/22 LVEF 65 to 28%, grade 1 diastolic dysfunction, no R WMA, normal PASP, no significant valvular abnormalities.  She was seen by Dr. Margaretann Daniels most recently 09/26/2020.  Her palpitations and blood pressure well controlled on metoprolol succinate 25 mg daily.  Noted significant stress as her father was recently diagnosed with stage V renal disease and was not pursuing dialysis and her mother has leukemia.  Symptoms were well controlled her Toprol dose was continued.  She presents today for follow-up after contacting the office noting hypertension and palpitations. Tells me she has been having episodes of feeling lightheaded at rest and feeling "pupils being squeezed". She had started supplement called 'berberine' which on her research can cause dizziness so she stopped taking two days ago. She has a blood pressure cuff at home which is a wrist cuff. Readings at home 118-148/70-92 but most often systolic [id number]. Discussed that wrist cuffs often inaccurate. She stays well hydrated drinking 60 ounces of water per day.  Occasional green tea or glass of unsweet tea but no significant caffeine intake.  Her fasting blood glucose has been 10 6-1 16 with no hypoglycemia.  She eats 3 meals throughout the day and also snacks on nuts.  No significant lightheadedness position changes.  Notes her integrative health provider has suggested Jade Daniels for weight loss and is working on obtaining prior Jade Daniels.   EKGs/Labs/Other Studies Reviewed:   The following studies were reviewed today:  Echo 07/09/2020  1. Left ventricular ejection fraction, by estimation, is 65 to 70%. The  left ventricle has normal function. The left  ventricle has no regional  wall motion abnormalities. Left ventricular diastolic parameters are  consistent with Grade I diastolic  dysfunction (impaired relaxation).   2. Right ventricular systolic function is normal. The right ventricular  size is normal. There is normal pulmonary artery systolic pressure.   3. The mitral valve is abnormal. Trivial mitral valve regurgitation.   4. The aortic valve is tricuspid. Aortic valve regurgitation is not  visualized.   5. The inferior vena cava is normal in size with greater than 50%  respiratory variability, suggesting right atrial pressure of 3 mmHg.   Long-term monitor 07/10/2020 Patient had a min HR of 52 bpm, max HR of 146 bpm, and avg HR of 75 bpm. Predominant underlying rhythm was Sinus Rhythm. 2 Supraventricular Tachycardia runs occurred, the run with the fastest interval lasting 5 beats with a max rate of 146 bpm (avg 137  bpm); the run with the fastest interval was also the longest. Isolated SVEs were rare (<1.0%), SVE Couplets were rare (<1.0%), and SVE Triplets were rare (<1.0%). Isolated VEs were occasional (1.9%, [id number]), VE Couplets were rare (<1.0%, 2), and no VE  Triplets were present. Ventricular Trigeminy was present.   EKG: No EKG today.  Recent Labs: No results found for requested labs within last 8760 hours.  Recent Lipid Panel    Component Value Date/Time   CHOL 196 11/17/2017 0831   TRIG 192 (H) 11/17/2017 0831   HDL 43 11/17/2017 0831   CHOLHDL 4.6 (H) 11/17/2017 0831   LDLCALC 115 (H) 11/17/2017 0831    Home Medications   Current Meds  Medication Sig   [DISCONTINUED] metoprolol succinate (TOPROL-XL) 25 MG 24 hr tablet TAKE 1 TABLET BY MOUTH DAILY. TAKE WITH OR IMMEDIATELY FOLLOWING A MEAL.     Review of Systems      All other systems reviewed and are otherwise negative except as noted above.  Physical Exam    VS:  BP 116/80   Pulse 63   Ht 5' 4"  (1.626 m)   Wt 177 lb 8 oz (80.5 kg)   LMP 10/13/2009    SpO2 97%   BMI 30.47 kg/m  , BMI Body mass index is 30.47 kg/m.  Wt Readings from Last 3 Encounters:  11/28/20 177 lb 8 oz (80.5 kg)  09/26/20 174 lb 6.4 oz (79.1 kg)  03/18/20 170 lb (77.1 kg)    GEN: Well nourished, well developed, in no acute distress. HEENT: normal. Neck: Supple, no JVD, carotid bruits, or masses. Cardiac: RRR, no murmurs, rubs, or gallops. No clubbing, cyanosis, edema.  Radials/PT 2+ and equal bilaterally.  Respiratory:  Respirations regular and unlabored, clear to auscultation bilaterally. GI: Soft, nontender, nondistended. MS: No deformity or atrophy. Skin: Warm and dry, no rash. Neuro:  Strength and sensation are intact. Psych: Normal affect.  Assessment & Plan   Palpitations / Lightheadedness -echo 629/22 with no significant abnormalities and normal LVEF.  Reports couple week history of lightheadedness.  Not noticeable with position changes  and stays well-hydrated, low suspicion for orthostatic hypotension.  She did recently start a supplement that can cause dizziness and discontinued 2 days ago, discussed that it may take a couple days for that to be out of her system.  We will reduce her metoprolol succinate to 12.5 mg daily.  If she notes increased palpitations she will return to 25 mg daily dosing.  Essential hypertension-BP well controlled in clinic.  Reports home readings elevated 130s to 140s but on wrist cuff.  Anticipate risk of inaccurate.  Encouraged her to purchase Omron arm cuff.  She will check her blood pressure after resting 5 to 10 minutes with an arm cuff and report readings consistently greater than 130/80.  Hyperlipidemia -continues to prefer lifestyle changes.  She is working with her integrative health provider to start [place] for weight loss and hopeful cholesterol benefit of weight loss.  Disposition: Follow up 09/2021 with Dr. Margaretann Daniels.  Signed, Loel Dubonnet, NP 11/28/2020, 1:23 PM  [facility]

## 2020-11-28 NOTE — Patient Instructions (Addendum)
Medication Instructions:  Your physician has recommended you make the following change in your medication:   Change: Metoprolol Succinate (Toprol-Xl) from 11m to 12.523m(1/2 tablet) daily   *If you need a refill on your cardiac medications before your next appointment, please call your pharmacy*   Lab Work: None ordered today   Testing/Procedures: None ordered today    Follow-Up: [facility]  As part of our continuing mission to provide you with exceptional heart care, we have created designated Provider Care Teams.  These Care Teams include your primary Cardiologist (physician) and Advanced Practice Providers (APPs -  Physician Assistants and Nurse Practitioners) who all work together to provide you with the care you need, when you need it.  We recommend signing up for the patient portal called "MyChart".  Sign up information is provided on this After Visit Summary.  MyChart is used to connect with patients for Virtual Visits (Telemedicine).  Patients are able to view lab/test results, encounter notes, upcoming appointments, etc.  Non-urgent messages can be sent to your provider as well.   To learn more about what you can do with MyChart, go to [email or web address]   Your next appointment:   September 2023  The format for your next appointment:   In Person  Provider:   GaElouise MunroeMD  or APPs    Other Instructions Tips to Measure your Blood Pressure Correctly Recommend purchasing an Omron blood pressure cuff.  To determine whether you have hypertension, a medical professional will take a blood pressure reading. How you prepare for the test, the position of your arm, and other factors can change a blood pressure reading by 10% or more. That could be enough to hide high blood pressure, start you on a drug you don't really need, or lead your doctor to incorrectly adjust your medications.  National and international  guidelines offer specific instructions for measuring blood pressure. If a doctor, nurse, or medical assistant isn't doing it right, don't hesitate to ask him or her to get with the guidelines.  Here's what you can do to ensure a correct reading:  Don't drink a caffeinated beverage or smoke during the 30 minutes before the test.  Sit quietly for five minutes before the test begins.  During the measurement, sit in a chair with your feet on the floor and your arm supported so your elbow is at about heart level.  The inflatable part of the cuff should completely cover at least 80% of your upper arm, and the cuff should be placed on bare skin, not over a shirt.  Don't talk during the measurement.  Have your blood pressure measured twice, with a brief break in between. If the readings are different by 5 points or more, have it done a third time.  In 2017, new guidelines from the [employer], and nine other health organizations lowered the diagnosis of high blood pressure to 130/80 mm Hg or higher for all adults. The guidelines also redefined the various blood pressure categories to now include normal, elevated, Stage 1 hypertension, Stage 2 hypertension, and hypertensive crisis (see "Blood pressure categories").  Blood pressure categories  Blood pressure category SYSTOLIC (upper number)  DIASTOLIC (lower number)  Normal Less than 120 mm Hg and Less than 80 mm Hg  Elevated 120-129 mm Hg and Less than 80 mm Hg  High blood pressure: Stage 1 hypertension 130-139 mm Hg or  80-89 mm Hg  High blood pressure: Stage 2 hypertension 140 mm Hg or higher or 90 mm Hg or higher  Hypertensive crisis (consult your doctor immediately) Higher than 180 mm Hg and/or Higher than 120 mm Hg  Source: American Heart Association and American Stroke Association. For more on getting your blood pressure under control, buy Controlling Your Blood Pressure, a Special Health Report  from [facility].   Blood Pressure Log   Date   Time  Blood Pressure  Position  Example: Nov 1 9 AM 124/78 sitting

## 2021-03-06 ENCOUNTER — Encounter (HOSPITAL_BASED_OUTPATIENT_CLINIC_OR_DEPARTMENT_OTHER): Payer: Self-pay | Admitting: *Deleted

## 2021-03-06 ENCOUNTER — Ambulatory Visit (INDEPENDENT_AMBULATORY_CARE_PROVIDER_SITE_OTHER): Payer: 59 | Admitting: Obstetrics & Gynecology

## 2021-03-06 ENCOUNTER — Other Ambulatory Visit: Payer: Self-pay

## 2021-03-06 ENCOUNTER — Encounter (HOSPITAL_BASED_OUTPATIENT_CLINIC_OR_DEPARTMENT_OTHER): Payer: Self-pay | Admitting: Obstetrics & Gynecology

## 2021-03-06 VITALS — BP 124/68 | HR 74 | Ht 64.0 in | Wt 174.6 lb

## 2021-03-06 DIAGNOSIS — E063 Autoimmune thyroiditis: Secondary | ICD-10-CM

## 2021-03-06 DIAGNOSIS — Z01419 Encounter for gynecological examination (general) (routine) without abnormal findings: Secondary | ICD-10-CM | POA: Diagnosis not present

## 2021-03-06 DIAGNOSIS — M797 Fibromyalgia: Secondary | ICD-10-CM

## 2021-03-06 DIAGNOSIS — I10 Essential (primary) hypertension: Secondary | ICD-10-CM | POA: Diagnosis not present

## 2021-03-06 NOTE — Progress Notes (Signed)
51 y.o. [id number] Married white female here for annual exam.  Working on watching sugar and carbs.  Denies vaginal bleeding.    Patient's last menstrual period was 10/13/2009.          Sexually active: Yes.    The current method of family planning is status post hysterectomy.     Upstream - 03/06/21 1022       Pregnancy Intention Screening   Does the patient want to become pregnant in the next year? No    Does the patient's partner want to become pregnant in the next year? No    Would the patient like to discuss contraceptive options today? No            Health Maintenance: Pap:  06/21/2016 Negative History of abnormal Pap:  no MMG:  10/01/2020 Negative Colonoscopy:  pt reports she did a cologuard last year.  I do not have this result Screening Labs: will have this done with naturopathic provider next month   reports that she has never smoked. She has never used smokeless tobacco. She reports current alcohol use of about 1.0 standard drink per week. She reports that she does not use drugs.  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 TAKE WITH OR IMMEDIATELY FOLLOWING A MEAL. 90 tablet 3   No current facility-administered medications for this visit.    Family History  Problem Relation Age of Onset   Hypertension Father    Thyroid disease Father    Diabetes Father    Kidney failure  Father    Diabetes Mother    Leukemia Mother     Review of Systems  All other systems reviewed and are negative.  Exam:   BP 124/68 (BP Location: Right Arm, Patient Position: Sitting, Cuff Size: Large)    Pulse 74    Ht 5' 4"  (1.626 m) Comment: reported   Wt 174 lb 9.6 oz (79.2 kg)    LMP 10/13/2009    BMI 29.97 kg/m   Height: 5' 4"  (162.6 cm) (reported)  General appearance: alert, cooperative and appears stated age Head: Normocephalic, without obvious abnormality, atraumatic Neck: no adenopathy, supple, symmetrical, trachea midline and thyroid normal to inspection and palpation Lungs: clear to auscultation bilaterally Breasts: normal appearance, no masses or tenderness Heart: regular rate and rhythm Abdomen: soft, non-tender; bowel sounds normal; no masses,  no organomegaly Extremities: extremities normal, atraumatic, no cyanosis or edema Skin: Skin color, texture, turgor normal. No rashes or lesions Lymph nodes: Cervical, supraclavicular, and axillary nodes normal. No abnormal inguinal nodes palpated Neurologic: Grossly normal   Pelvic: External genitalia:  no lesions              Urethra:  normal appearing urethra with no masses, tenderness or lesions  Bartholins and Skenes: normal                 Vagina: normal appearing vagina with normal color and no discharge, no lesions              Cervix: absent              Pap taken: No. Bimanual Exam:  Uterus:  uterus absent              Adnexa: no mass, fullness, tenderness               Rectovaginal: Confirms               Anus:  normal sphincter tone, no lesions  Chaperone, Octaviano Batty, CMA, was present for exam.  Assessment/Plan: 1. Well woman exam with routine gynecological exam - pap not indicated - mmg up to date - will request results for cologuard - vaccines reviewed/updated - has lab work planned next month  2. Primary hypertension - on metoprolol   3. Fibromyalgia  4. Hashimoto's thyroiditis -  on no medications

## 2021-03-10 ENCOUNTER — Ambulatory Visit (HOSPITAL_BASED_OUTPATIENT_CLINIC_OR_DEPARTMENT_OTHER): Payer: 59 | Admitting: Obstetrics & Gynecology

## 2021-05-02 IMAGING — CT CT RENAL STONE PROTOCOL
2 of 4 series · 17 of 46 positions shown, 19 images · non-contrast
Comparison: None.

CLINICAL DATA: Left-sided pain.  Lump.  Question kidney stone.

EXAM:
CT ABDOMEN AND PELVIS WITHOUT CONTRAST
TECHNIQUE: Multidetector CT imaging of the abdomen and pelvis was performed
following the standard protocol without IV contrast.

[Series 2: axial st · axial · 0.73mm/px · z∈[-506,-36]mm · 14 of 106 slices shown, 16 images]
[im 6/106  soft-tissue]
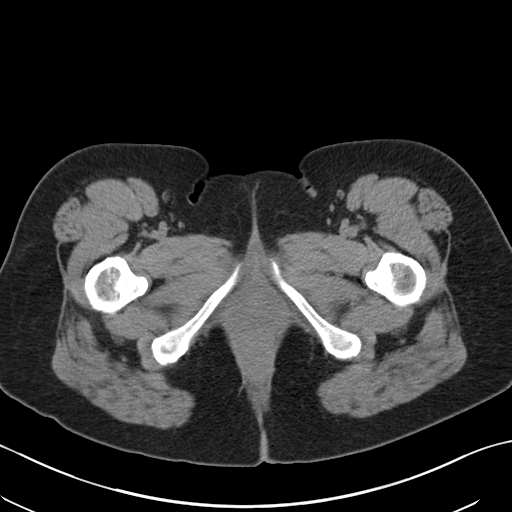
[im 6/106  bone]
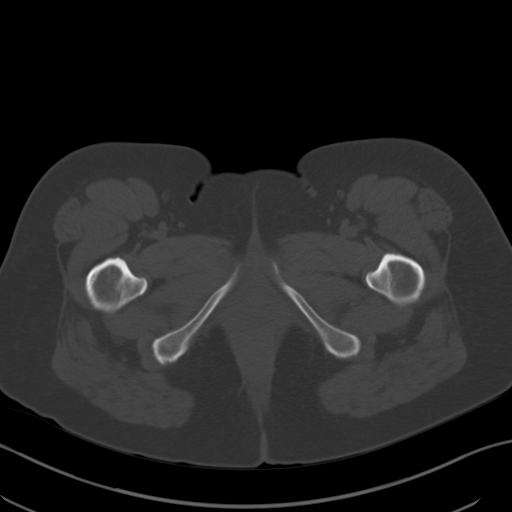
[im 12/106  soft-tissue]
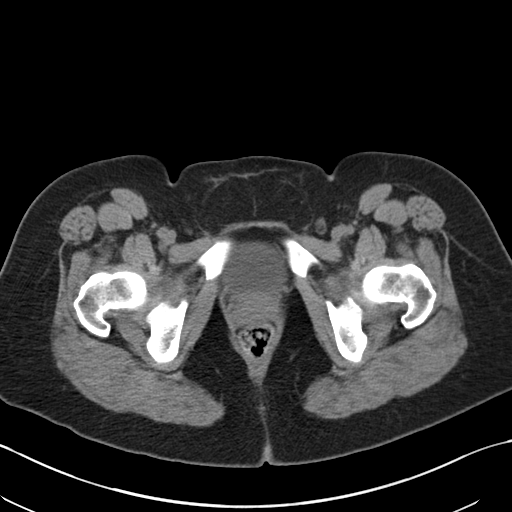
[im 24/106  soft-tissue]
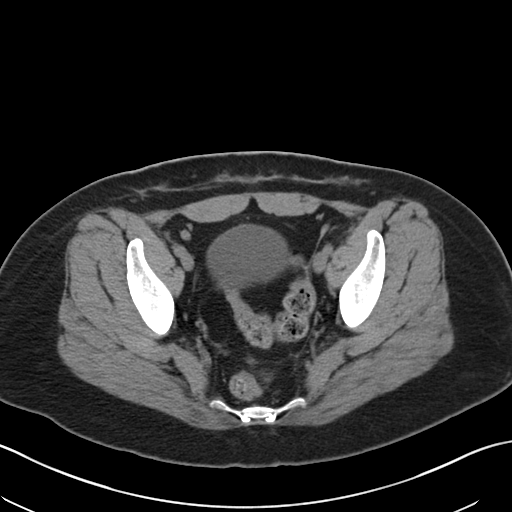
[im 30/106  soft-tissue]
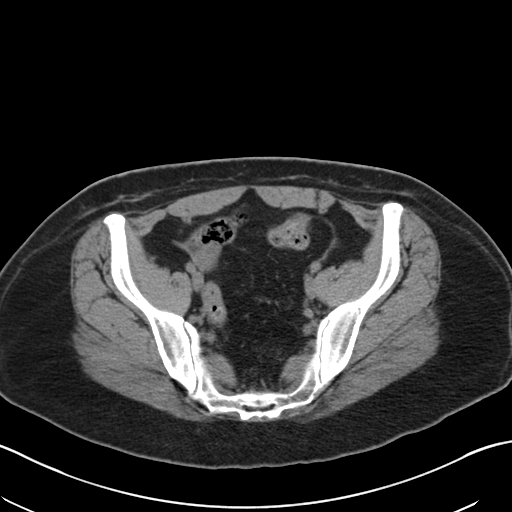
[im 36/106  soft-tissue]
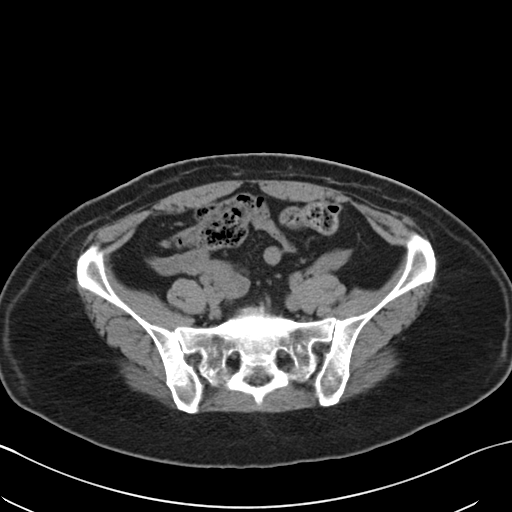
[im 41/106  soft-tissue]
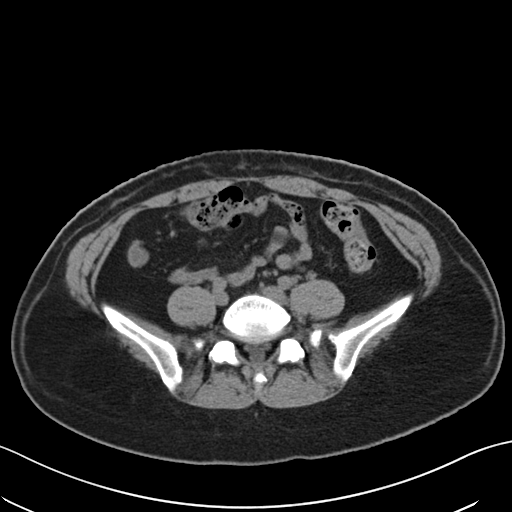
[im 47/106  soft-tissue]
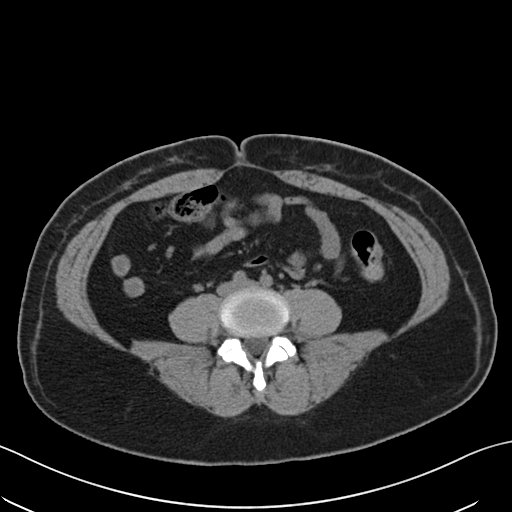
[im 59/106  soft-tissue]
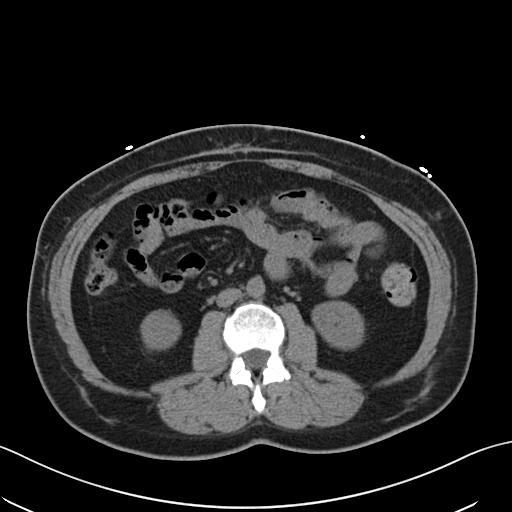
[im 65/106  soft-tissue]
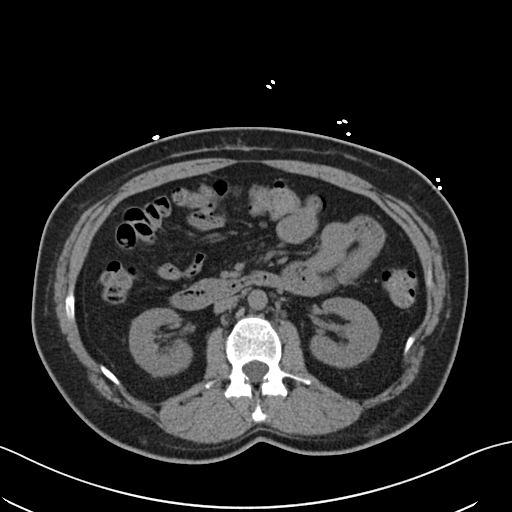
[im 65/106  bone]
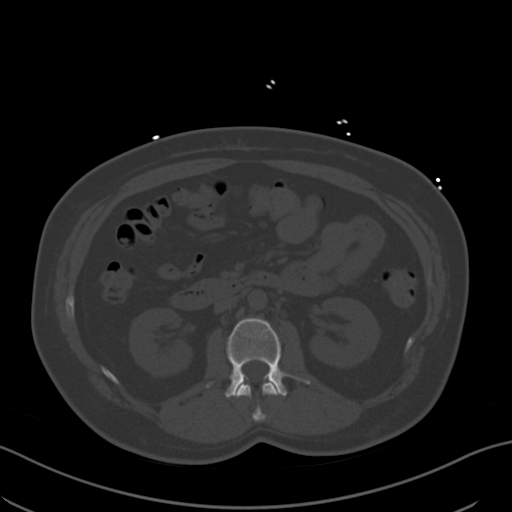
[im 71/106  soft-tissue]
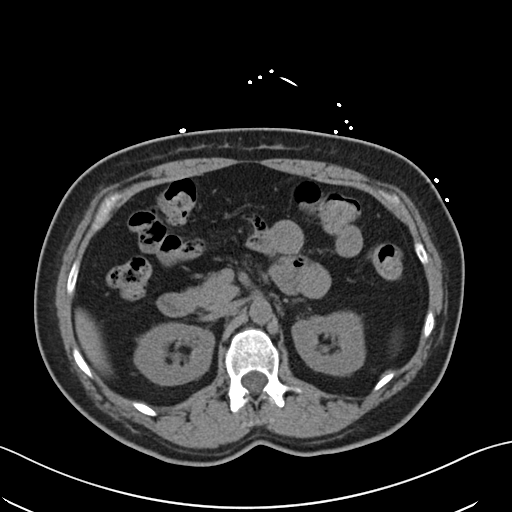
[im 76/106  soft-tissue]
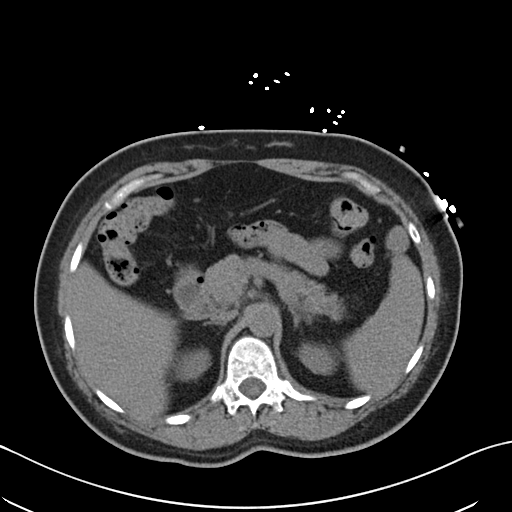
[im 82/106  soft-tissue]
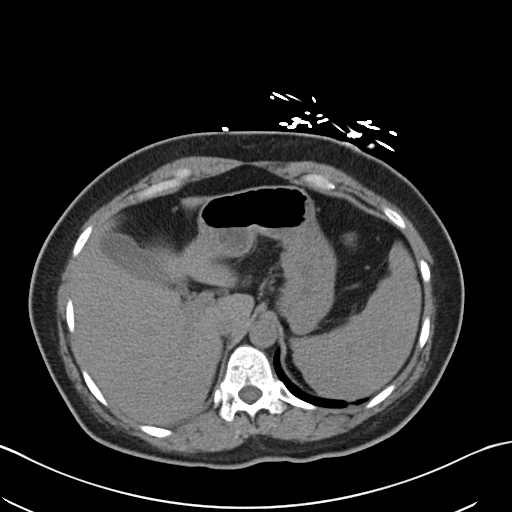
[im 94/106  soft-tissue]
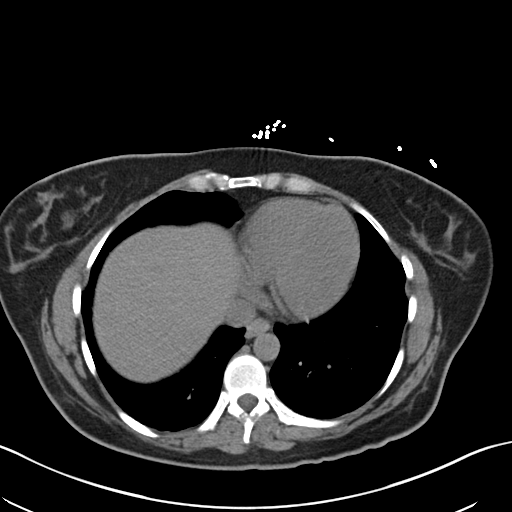
[im 100/106  soft-tissue]
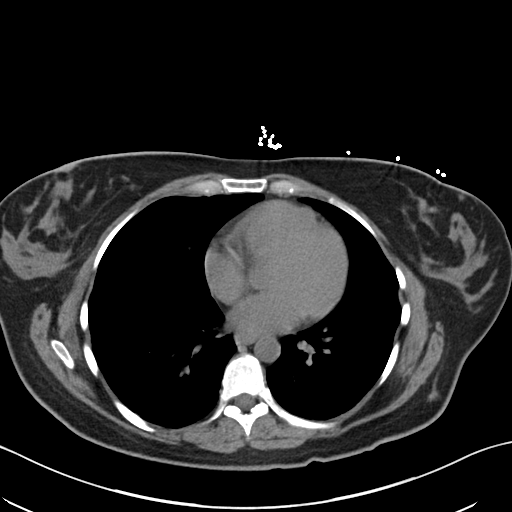

[Series 5: coronal · coronal · 0.81mm/px · 3 of 154 slices shown]
[im 52/154  soft-tissue]
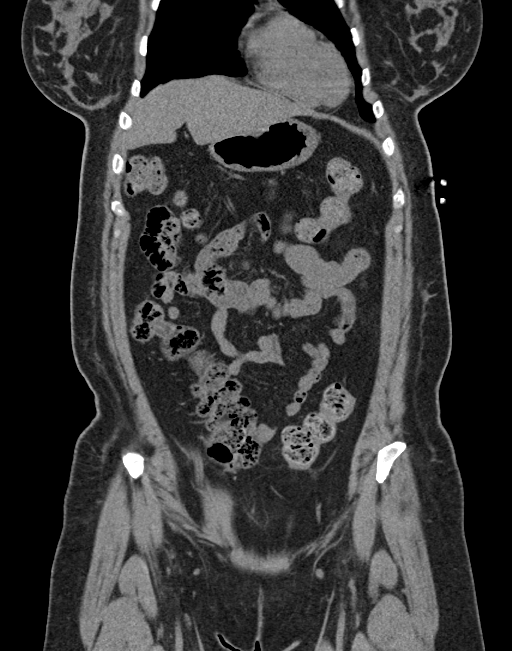
[im 69/154  soft-tissue]
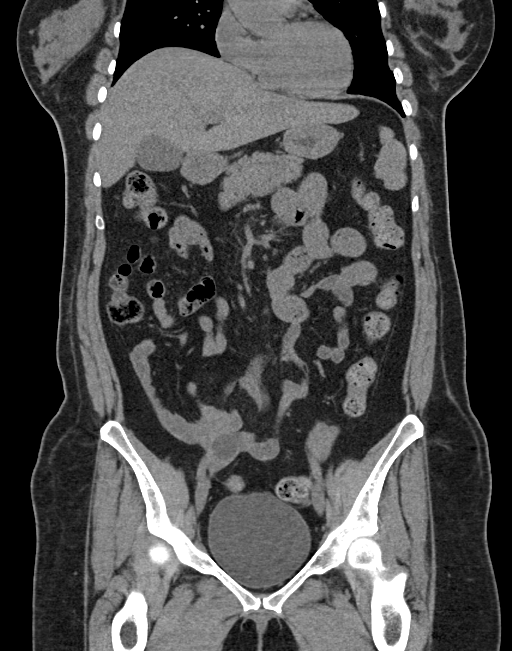
[im 86/154  soft-tissue]
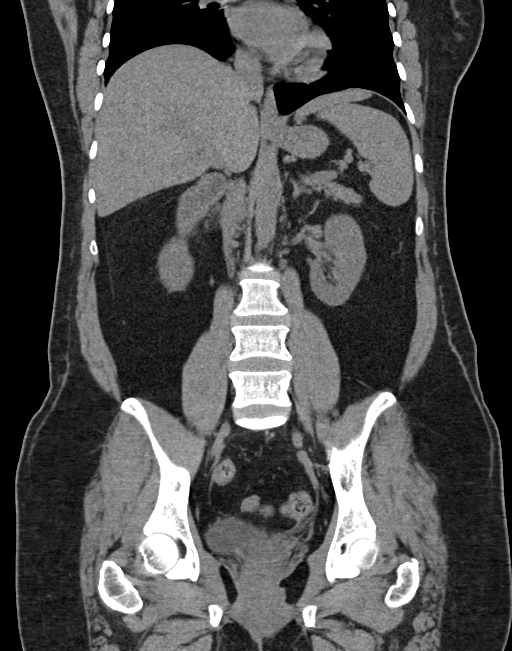

[17 of 46 positions shown; findings below may reference images not displayed]

FINDINGS: Lower chest: Normal

Hepatobiliary: Liver parenchyma is normal.  No calcified gallstones.

Pancreas: Normal

Spleen: Normal

Adrenals/Urinary Tract: Adrenal glands are normal. Kidneys are
normal. No cyst, mass, or hydronephrosis. Bladder is normal.

Stomach/Bowel: No visible bowel pathology.

Vascular/Lymphatic: Normal

Reproductive: Previous hysterectomy.  No pelvic mass.

Other: No free fluid or air.

Musculoskeletal: Normal. Clinical history reports some sort of lump
at the left lower ribcage. Abnormality is visible by CT.
IMPRESSION: Negative study. No evidence of urinary tract stone disease or other
pathology. No other significant finding. Previous hysterectomy.

## 2021-06-03 ENCOUNTER — Ambulatory Visit (INDEPENDENT_AMBULATORY_CARE_PROVIDER_SITE_OTHER): Payer: 59

## 2021-06-03 DIAGNOSIS — R300 Dysuria: Secondary | ICD-10-CM

## 2021-06-03 LAB — POCT URINALYSIS DIPSTICK
Bilirubin, UA: NEGATIVE
Glucose, UA: NEGATIVE
Ketones, UA: NEGATIVE
Leukocytes, UA: NEGATIVE
Nitrite, UA: NEGATIVE
Protein, UA: NEGATIVE
Spec Grav, UA: 1.02 (ref 1.010–1.025)
Urobilinogen, UA: 0.2 E.U./dL
pH, UA: 6 (ref 5.0–8.0)

## 2021-06-03 NOTE — Progress Notes (Signed)
Patient came today to give a urine  sample. Patient states that she has had some lower abdominal pain as well as pain while urinating. Urine sample was tested and sent off for urine culture as well. tbw

## 2021-06-05 LAB — URINE CULTURE

## 2021-06-25 ENCOUNTER — Telehealth: Payer: Self-pay | Admitting: Internal Medicine

## 2021-06-25 NOTE — Telephone Encounter (Signed)
Called pt she states she has been burping a lot, and the pain or right above her breast. Pt states "I do have an ulcer that rears it's ugly head once in awhile. I have heart flutters sometimes. I can not comfortable lay on my left side at this point. I have taken some Mylanta for the burping, I also have some over the counter medication but I forget to take it 20 mins before I eat. Last time I was there she said we can try Nitro. I was wondering if this is that time?" Pt advised this message will get to Dr. Margaretann Loveless and to go to the ED if her symptoms get worst.

## 2021-06-25 NOTE — Telephone Encounter (Signed)
Pt c/o of Chest Pain: STAT if CP now or developed within 24 hours  1. Are you having CP right now? No   2. Are you experiencing any other symptoms (ex. SOB, nausea, vomiting, sweating)? Burping, left shoulder pain   3. How long have you been experiencing CP? Since Saturday 06/10  4. Is your CP continuous or coming and going? Coming and going   5. Have you taken Nitroglycerin? No, doesn't have any   Wanting Nitro prescribed, states it was discussed when she last saw her with this issue.  ?

## 2021-06-26 ENCOUNTER — Emergency Department (HOSPITAL_COMMUNITY): Payer: 59

## 2021-06-26 ENCOUNTER — Emergency Department (HOSPITAL_COMMUNITY)
Admission: EM | Admit: 2021-06-26 | Discharge: 2021-06-27 | Disposition: A | Discharge status: Home / Self Care | Payer: 59 | Attending: Emergency Medicine | Admitting: Emergency Medicine

## 2021-06-26 ENCOUNTER — Encounter (HOSPITAL_COMMUNITY): Payer: Self-pay | Admitting: Emergency Medicine

## 2021-06-26 DIAGNOSIS — I10 Essential (primary) hypertension: Secondary | ICD-10-CM | POA: Diagnosis not present

## 2021-06-26 DIAGNOSIS — R200 Anesthesia of skin: Secondary | ICD-10-CM | POA: Diagnosis not present

## 2021-06-26 DIAGNOSIS — R079 Chest pain, unspecified: Secondary | ICD-10-CM | POA: Diagnosis present

## 2021-06-26 DIAGNOSIS — Z79899 Other long term (current) drug therapy: Secondary | ICD-10-CM | POA: Diagnosis not present

## 2021-06-26 LAB — BASIC METABOLIC PANEL
Anion gap: 9 (ref 5–15)
BUN: 21 mg/dL — ABNORMAL HIGH (ref 6–20)
CO2: 24 mmol/L (ref 22–32)
Calcium: 9.6 mg/dL (ref 8.9–10.3)
Chloride: 106 mmol/L (ref 98–111)
Creatinine, Ser: 0.96 mg/dL (ref 0.44–1.00)
GFR, Estimated: >60 mL/min (ref >60)
Glucose, Bld: 129 mg/dL — ABNORMAL HIGH (ref 70–99)
Potassium: 3.7 mmol/L (ref 3.5–5.1)
Sodium: 139 mmol/L (ref 135–145)

## 2021-06-26 LAB — CBC
HCT: 39.1 % (ref 36.0–46.0)
Hemoglobin: 13 g/dL (ref 12.0–15.0)
MCH: 27.8 pg (ref 26.0–34.0)
MCHC: 33.2 g/dL (ref 30.0–36.0)
MCV: 83.7 fL (ref 80.0–100.0)
Platelets: 319 10*3/uL (ref 150–400)
RBC: 4.67 MIL/uL (ref 3.87–5.11)
RDW: 13.4 % (ref 11.5–15.5)
WBC: 8.7 10*3/uL (ref 4.0–10.5)
nRBC: 0 % (ref 0.0–0.2)

## 2021-06-26 LAB — TROPONIN I (HIGH SENSITIVITY): Troponin I (High Sensitivity): <2 ng/L (ref <18)

## 2021-06-26 LAB — I-STAT BETA HCG BLOOD, ED (MC, WL, AP ONLY): I-stat hCG, quantitative: <5 m[IU]/mL (ref <5)

## 2021-06-26 NOTE — ED Provider Triage Note (Signed)
Emergency Medicine Provider Triage Evaluation Note  Jade Daniels , a 51 y.o. female  was evaluated in triage.  Pt complains of chest pain. She states that same began suddenly when she was out biking around 8:30 this evening. She endorses 4 episodes of sharp pain in her left chest and associated numbness in the left she states that the chest pain is now completely resolved, however the sensation in her arm has not completely resolved.  She denies any shortness of breath, diaphoresis, nausea, or vomiting. No history of heart problems.  Review of Systems  Positive:  Negative:   Physical Exam  BP (!) 140/91   Pulse 86   Temp 98 F (36.7 C) (Oral)   Resp 18   Ht 5' 4"  (1.626 m)   Wt 77.1 kg   LMP 10/13/2009   SpO2 100%   BMI 29.18 kg/m  Gen:   Awake, no distress   Resp:  Normal effort  MSK:   Moves extremities without difficulty  Other:    Medical Decision Making  Medically screening exam initiated at 10:06 PM.  Appropriate orders placed.  Jade Daniels was informed that the remainder of the evaluation will be completed by another provider, this initial triage assessment does not replace that evaluation, and the importance of remaining in the ED until their evaluation is complete.     Jade Daniels 06/26/21 2209

## 2021-06-26 NOTE — ED Triage Notes (Signed)
Patient c/o L chest pain radiating down left arm x20 minutes after finishing a bike ride. States recently made cardiology appt for dull chest pain x1 week ago. Denies SOB.

## 2021-06-27 LAB — TROPONIN I (HIGH SENSITIVITY): Troponin I (High Sensitivity): <2 ng/L (ref <18)

## 2021-06-27 NOTE — ED Provider Notes (Signed)
[facility] DEPT Provider Note   CSN: [id number] Arrival date & time: 06/26/21  2114     History  Chief Complaint  Patient presents with   Chest Pain    Jade Daniels is a 51 y.o. female.  Patient presents to the emergency department for evaluation of chest pain.  Patient has been having intermittent episodes of chest pain over the past week.  Patient had a pressure sensation of the left side of her chest with some numbness of the left arm after riding her bike 1 week ago.  She was experiencing some irregularity or possibly rapid heartbeat at that time.  Patient has since had a couple of episodes similar to this, at rest.  Episodes last for a number of minutes and then resolved.  Today she went for a bike ride and started having sharp, stabbing pains in the left chest.  Patient reports that it would hit her all at once, last for a brief period of time and then resolved.  It happened 4 times.  There was, once again, fluttering or rapid heartbeat.  At time of her valuation in the department, her symptoms have resolved.       Home Medications Prior to Admission medications   Medication Sig Start Date End Date Taking? Authorizing Provider  liothyronine (CYTOMEL) 5 MCG tablet Take 5 mcg by mouth every morning. 06/09/21  Yes [provider]  metoprolol succinate (TOPROL-XL) 25 MG 24 hr tablet Take 0.5 tablets (12.5 mg total) by mouth daily. TAKE WITH OR IMMEDIATELY FOLLOWING A MEAL. 11/28/20  Yes Loel Dubonnet, NP  SEMAGLUTIDE PO Take by mouth.   Yes [provider]      Allergies    Azithromycin, Bactrim [sulfamethoxazole-trimethoprim], Erythromycin base, Lidocaine, Codeine, Ergocalciferol, and Synthroid [levothyroxine sodium]    Review of Systems   Review of Systems  Physical Exam Updated Vital Signs BP 128/78   Pulse 76   Temp 98 F (36.7 C) (Oral)   Resp 14   Ht 5' 4"  (1.626 m)   Wt 77.1 kg   LMP 10/13/2009   SpO2 97%    BMI 29.18 kg/m  Physical Exam Vitals and nursing note reviewed.  Constitutional:      General: She is not in acute distress.    Appearance: She is well-developed.  HENT:     Head: Normocephalic and atraumatic.     Mouth/Throat:     Mouth: Mucous membranes are moist.  Eyes:     General: Vision grossly intact. Gaze aligned appropriately.     Extraocular Movements: Extraocular movements intact.     Conjunctiva/sclera: Conjunctivae normal.  Cardiovascular:     Rate and Rhythm: Normal rate and regular rhythm.     Pulses: Normal pulses.     Heart sounds: Normal heart sounds, S1 normal and S2 normal. No murmur heard.    No friction rub. No gallop.  Pulmonary:     Effort: Pulmonary effort is normal. No respiratory distress.     Breath sounds: Normal breath sounds.  Abdominal:     General: Bowel sounds are normal.     Palpations: Abdomen is soft.     Tenderness: There is no abdominal tenderness. There is no guarding or rebound.     Hernia: No hernia is present.  Musculoskeletal:        General: No swelling.     Cervical back: Full passive range of motion without pain, normal range of motion and neck supple. No spinous process  tenderness or muscular tenderness. Normal range of motion.     Right lower leg: No edema.     Left lower leg: No edema.  Skin:    General: Skin is warm and dry.     Capillary Refill: Capillary refill takes less than 2 seconds.     Findings: No ecchymosis, erythema, rash or wound.  Neurological:     General: No focal deficit present.     Mental Status: She is alert and oriented to person, place, and time.     GCS: GCS eye subscore is 4. GCS verbal subscore is 5. GCS motor subscore is 6.     Cranial Nerves: Cranial nerves 2-12 are intact.     Sensory: Sensation is intact.     Motor: Motor function is intact.     Coordination: Coordination is intact.  Psychiatric:        Attention and Perception: Attention normal.        Mood and Affect: Mood normal.         Speech: Speech normal.        Behavior: Behavior normal.     ED Results / Procedures / Treatments   Labs (all labs ordered are listed, but only abnormal results are displayed) Labs Reviewed  BASIC METABOLIC PANEL - Abnormal; Notable for the following components:      Result Value   Glucose, Bld 129 (*)    BUN 21 (*)    All other components within normal limits  CBC  I-STAT BETA HCG BLOOD, ED ([facility], [facility], [facility] ONLY)  TROPONIN I (HIGH SENSITIVITY)  TROPONIN I (HIGH SENSITIVITY)    EKG EKG Interpretation  Date/Time:  Friday June 26 2021 21:35:28 EDT Ventricular Rate:  80 PR Interval:  145 QRS Duration: 81 QT Interval:  355 QTC Calculation: 410 R Axis:   44 Text Interpretation: Sinus rhythm Normal ECG Confirmed by Orpah Greek [phone]) on 06/27/2021 12:26:09 AM  Radiology DG Chest 2 View  Result Date: 06/26/2021 CLINICAL DATA:  Left chest pain EXAM: CHEST - 2 VIEW COMPARISON:  None Available. FINDINGS: The heart size and mediastinal contours are within normal limits. Both lungs are clear. The visualized skeletal structures are unremarkable. IMPRESSION: No active cardiopulmonary disease. Electronically Signed   By: Fidela Salisbury M.D.   On: 06/26/2021 22:03    Procedures Procedures    Medications Ordered in ED Medications - No data to display  ED Course/ Medical Decision Making/ A&P                           Medical Decision Making Amount and/or Complexity of Data Reviewed External Data Reviewed: labs, radiology and ECG. Labs: ordered. Decision-making details documented in ED Course. Radiology: ordered and independent interpretation performed. Decision-making details documented in ED Course. ECG/medicine tests: ordered and independent interpretation performed. Decision-making details documented in ED Course.   Patient presents to the ER for evaluation of chest pain.  She first had chest pain with exertion earlier this week.  She has since had several self-limited  episodes, both with exertion and at rest.  She does have multiple cardiac risk factors including hypertension and borderline cholesterol.  No family history of early heart disease.  Patient had an episode today that sounded atypical for cardiac etiology.  She had 4 episodes of sharp stabbing pain that was brief and self-limited while biking.  This has resolved by the time I evaluated her.  Patient's vital signs have returned  to normal.  She was mildly hypertensive initially.  All other vital signs normal.  I have reviewed her prior work-ups.  She has had a myocardial perfusion scan in 2020.  This was felt to be low risk, no findings.  Patient underwent CT cardiac scoring in 2022, calcium score was 0.  She had a follow-up echo in 2022 which was felt to be normal.  At that time she also had long-term cardiac monitoring which showed only PVCs.  Patient has had thorough cardiac evaluation by her cardiologist over the last couple of years without any worrisome findings.  She does, however, have multiple cardiac risk factors and some exertional symptoms currently, will refer back to cardiology.  Her EKG is normal today.  2 troponins negative, no concerning findings on work-up.  She is currently symptom-free without intervention.  She will be discharged to follow-up with her cardiologist as soon as possible, return for recurrent chest pain.  I advised her not to ride her bike or do any exercise until she sees her cardiologist.        Final Clinical Impression(s) / ED Diagnoses Final diagnoses:  Chest pain, unspecified type    Rx / DC Orders ED Discharge Orders     None         Ivyonna Hoelzel, Gwenyth Allegra, MD 06/27/21 0104

## 2021-06-29 NOTE — Telephone Encounter (Signed)
Attempted to call patient, left message for patient to call back to office.   

## 2021-06-30 NOTE — Telephone Encounter (Signed)
Patient was returning a call. Please advise

## 2021-06-30 NOTE — Telephone Encounter (Signed)
Thanks for the update!  Loel Dubonnet, NP

## 2021-06-30 NOTE — Telephone Encounter (Addendum)
Patient stated she had chest pain last Friday while bike riding. She went to the ED and was "cleared". Made appointment with Vella Raring for 6/23.

## 2021-07-03 ENCOUNTER — Encounter (HOSPITAL_BASED_OUTPATIENT_CLINIC_OR_DEPARTMENT_OTHER): Payer: Self-pay | Admitting: Family

## 2021-07-03 ENCOUNTER — Ambulatory Visit (HOSPITAL_BASED_OUTPATIENT_CLINIC_OR_DEPARTMENT_OTHER): Payer: 59 | Admitting: Family

## 2021-07-03 VITALS — BP 114/78 | HR 79 | Ht 64.0 in | Wt 174.1 lb

## 2021-07-03 DIAGNOSIS — R079 Chest pain, unspecified: Secondary | ICD-10-CM | POA: Diagnosis not present

## 2021-07-03 DIAGNOSIS — I10 Essential (primary) hypertension: Secondary | ICD-10-CM

## 2021-07-03 DIAGNOSIS — R002 Palpitations: Secondary | ICD-10-CM | POA: Diagnosis not present

## 2021-07-03 DIAGNOSIS — I493 Ventricular premature depolarization: Secondary | ICD-10-CM | POA: Diagnosis not present

## 2021-07-03 MED ORDER — METOPROLOL TARTRATE 50 MG PO TABS: ORAL_TABLET | ORAL | 0 refills | Status: DC

## 2021-07-03 MED ORDER — NITROGLYCERIN 0.4 MG SL SUBL: 0.4000 mg | SUBLINGUAL_TABLET | SUBLINGUAL | 3 refills | Status: AC | PRN

## 2021-07-03 MED ORDER — METOPROLOL SUCCINATE ER 25 MG PO TB24: 25.0000 mg | ORAL_TABLET | Freq: Every day | ORAL | 3 refills | Status: DC

## 2021-07-03 NOTE — Progress Notes (Signed)
Office Visit    Patient Name: Jade Daniels Date of Encounter: 07/03/2021  PCP:  Georgianne Fick, MD   [facility]  Cardiologist:  Parke Poisson, MD  Advanced Practice Provider:  No care team member to display Electrophysiologist:  None      Chief Complaint    Jade Daniels is a 51 y.o. female with a hx of hypertension, fibromyalgia, chest pain, palpitations presents today for follow up after ED visit for chest pain.   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   Past Surgical History:  Procedure Laterality Date   BREAST REDUCTION SURGERY  01/2015    eyelid surgery     HYSTEROSCOPY  04/2006   D&C    LAPAROSCOPIC TOTAL HYSTERECTOMY  11/10/09   [facility]   LIPOSUCTION  01/2015    full torso    LIPOSUCTION  05/21/2016   lower face lift  04/2019   OVARIAN CYST REMOVAL  2008    Allergies  Allergies  Allergen Reactions   Azithromycin Other (See Comments)    GI Upset Severe   Bactrim [Sulfamethoxazole-Trimethoprim] Itching and Other (See Comments)    Numbness and tingling in fingers   Erythromycin Base    Lidocaine    Codeine Rash   Ergocalciferol Palpitations and Other (See Comments)    Intolerance to 50,000 IU, can take lower dosage of OTC vit D    Synthroid [Levothyroxine Sodium] Rash    History of Present Illness    Jade Daniels is a 52 y.o. female with a hx of hypertension, fibromyalgia, chest pain, palpitations  last seen 11/28/20.   She was seen at [facility] November 2020.  She had low risk stress test 11/29/2018.  She was started on metoprolol for angina hypertension but experienced dizziness and dose was reduced to 12.5 mg twice daily with improvement in symptoms.  She was originally intended for coronary CTA but reported contrast dye allergy and this was deferred in favor of nuclear  stress test.  She was subsequently evaluated by Dr. Jacques Navy.  CT cardiac scoring 04/2020 with coronary calcium score of 0.  Long-term monitor 07/10/20 with primarily normal sinus rhythm average heart rate 75 bpm, 2 runs of SVT with fastest 5 beats at rate 1:46 PM, and PVC with burden of 1.9%.  Echocardiogram 629/22 LVEF 65 to 70%, grade 1 diastolic dysfunction, no R WMA, normal PASP, no significant valvular abnormalities.  She was seen by Dr. Jacques Navy 09/26/2020 -  palpitations and BP well controlled on metoprolol succinate 25 mg daily.  Seen 11/2020 noting lightheadedness which had resolved after stopping supplement berberine and Metoprolol dose was reduced to half tablet daily.   ED visit 06/26/21 with chest pain with workup unremarkable. Presents today for follow up independently. Continues to work on weight loss and taking a Semaglutide-B12 combo injection with her functional medicine provider. Notes her episode of chest pain associated with sensation of heart pounding forcefully or a stabbing pain, different than prior palpitations. It occurred while she was biking. Does worry about coronary disease as her maternal and paternal grandmother both had open heart surgery. Also notes that she has chest pain on occasion relieved by belching and has previously been told she has h.pylori but declined antibiotics.    EKGs/Labs/Other Studies Reviewed:   The following studies were reviewed today:  Echo  07/09/2020  1. Left ventricular ejection fraction, by estimation, is 65 to 70%. The  left ventricle has normal function. The left ventricle has no regional  wall motion abnormalities. Left ventricular diastolic parameters are  consistent with Grade I diastolic  dysfunction (impaired relaxation).   2. Right ventricular systolic function is normal. The right ventricular  size is normal. There is normal pulmonary artery systolic pressure.   3. The mitral valve is abnormal. Trivial mitral valve regurgitation.    4. The aortic valve is tricuspid. Aortic valve regurgitation is not  visualized.   5. The inferior vena cava is normal in size with greater than 50%  respiratory variability, suggesting right atrial pressure of 3 mmHg.   Long-term monitor 07/10/2020 Patient had a min HR of 52 bpm, max HR of 146 bpm, and avg HR of 75 bpm. Predominant underlying rhythm was Sinus Rhythm. 2 Supraventricular Tachycardia runs occurred, the run with the fastest interval lasting 5 beats with a max rate of 146 bpm (avg 137  bpm); the run with the fastest interval was also the longest. Isolated SVEs were rare (<1.0%), SVE Couplets were rare (<1.0%), and SVE Triplets were rare (<1.0%). Isolated VEs were occasional (1.9%, [id number]), VE Couplets were rare (<1.0%, 2), and no VE  Triplets were present. Ventricular Trigeminy was present.   EKG: No EKG today.  Recent Labs: 06/26/2021: BUN 21; Creatinine, Ser 0.96; Hemoglobin 13.0; Platelets 319; Potassium 3.7; Sodium 139  Recent Lipid Panel    Component Value Date/Time   CHOL 196 11/17/2017 0831   TRIG 192 (H) 11/17/2017 0831   HDL 43 11/17/2017 0831   CHOLHDL 4.6 (H) 11/17/2017 0831   LDLCALC 115 (H) 11/17/2017 0831    Home Medications   Current Meds  Medication Sig   liothyronine (CYTOMEL) 5 MCG tablet Take 5 mcg by mouth every morning.   metoprolol succinate (TOPROL-XL) 25 MG 24 hr tablet Take 0.5 tablets (12.5 mg total) by mouth daily. TAKE WITH OR IMMEDIATELY FOLLOWING A MEAL.   SEMAGLUTIDE PO Take by mouth.     Review of Systems      All other systems reviewed and are otherwise negative except as noted above.  Physical Exam    VS:  BP 114/78 (BP Location: Right Arm, Patient Position: Sitting, Cuff Size: Large)   Pulse 79   Ht 5\' 4"  (1.626 m)   Wt 174 lb 1.6 oz (79 kg)   LMP 10/13/2009   SpO2 97%   BMI 29.88 kg/m  , BMI Body mass index is 29.88 kg/m.  Wt Readings from Last 3 Encounters:  07/03/21 174 lb 1.6 oz (79 kg)  06/26/21 170 lb (77.1 kg)   03/06/21 174 lb 9.6 oz (79.2 kg)    GEN: Well nourished, well developed, in no acute distress. HEENT: normal. Neck: Supple, no JVD, carotid bruits, or masses. Cardiac: RRR, no murmurs, rubs, or gallops. No clubbing, cyanosis, edema.  Radials/PT 2+ and equal bilaterally.  Respiratory:  Respirations regular and unlabored, clear to auscultation bilaterally. GI: Soft, nontender, nondistended. MS: No deformity or atrophy. Skin: Warm and dry, no rash. Neuro:  Strength and sensation are intact. Psych: Normal affect.  Assessment & Plan   Chest pain - 04/2020 coronary calcium score of 0. However, given persistent chest pain plan for cardiac CTA to reassess and rule out soft plaque. Risk factors include family history, hyperlipidemia. If workup unremarkable, plan to refer to GI for further evaluation.   Palpitations -echo 6/22 with no significant abnormalities and normal  LVEF.  Notes increasing palpitations. Increase Toprol to 25mg  QD.   Essential hypertension-BP well controlled in clinic.  Continue Toprol.   Hyperlipidemia -continues to prefer lifestyle changes.   She is working with her integrative health provider and just started Semaglutide-B12 injections. Discussed if coronary atherosclerosis noted on CT would require prescription cholesterol medication.   Disposition: Follow up 09/2021 with Dr. Jacques Navy.  Signed, Alver Sorrow, NP 07/03/2021, 2:58 PM [facility]

## 2021-07-04 ENCOUNTER — Encounter (HOSPITAL_BASED_OUTPATIENT_CLINIC_OR_DEPARTMENT_OTHER): Payer: Self-pay | Admitting: Family

## 2021-07-15 NOTE — Telephone Encounter (Signed)
Patient was seen on 6/23 by Laurann Montana, NP- will close encounter.

## 2021-07-24 ENCOUNTER — Telehealth (HOSPITAL_COMMUNITY): Payer: Self-pay | Admitting: Emergency Medicine

## 2021-07-24 NOTE — Telephone Encounter (Signed)
Reaching out to patient to offer assistance regarding upcoming cardiac imaging study; pt verbalizes understanding of appt date/time, parking situation and where to check in, pre-test NPO status and medications ordered, and verified current allergies; name and call back number provided for further questions should they arise Marchia Bond RN Navigator Cardiac Imaging [facility] [phone] office [phone] cell  Arrival 800 '50mg'$  metoprolol tartrate Denies iv issues Aware of nitro

## 2021-07-27 ENCOUNTER — Ambulatory Visit (HOSPITAL_COMMUNITY)
Admission: RE | Admit: 2021-07-27 | Discharge: 2021-07-27 | Disposition: A | Discharge status: Home / Self Care | Payer: 59 | Source: Ambulatory Visit | Attending: Family | Admitting: Family

## 2021-07-27 DIAGNOSIS — R079 Chest pain, unspecified: Secondary | ICD-10-CM | POA: Diagnosis present

## 2021-07-27 DIAGNOSIS — R002 Palpitations: Secondary | ICD-10-CM | POA: Insufficient documentation

## 2021-07-27 DIAGNOSIS — I10 Essential (primary) hypertension: Secondary | ICD-10-CM | POA: Insufficient documentation

## 2021-07-27 MED ORDER — NITROGLYCERIN 0.4 MG SL SUBL
SUBLINGUAL_TABLET | SUBLINGUAL | Status: AC
  Filled 2021-07-27: qty 2

## 2021-07-27 MED ORDER — IOHEXOL 350 MG/ML SOLN
85.0000 mL | Freq: Once | INTRAVENOUS | Status: AC | PRN
  Administered 2021-07-27: 85 mL via INTRAVENOUS

## 2021-07-27 MED ORDER — NITROGLYCERIN 0.4 MG SL SUBL
0.8000 mg | SUBLINGUAL_TABLET | Freq: Once | SUBLINGUAL | Status: AC
  Administered 2021-07-27: 0.8 mg via SUBLINGUAL

## 2021-09-21 ENCOUNTER — Ambulatory Visit: Payer: 59 | Attending: Internal Medicine | Admitting: Internal Medicine

## 2021-09-21 ENCOUNTER — Encounter: Payer: Self-pay | Admitting: Internal Medicine

## 2021-09-21 VITALS — BP 130/84 | Ht 64.0 in | Wt 171.6 lb

## 2021-09-21 DIAGNOSIS — R002 Palpitations: Secondary | ICD-10-CM | POA: Diagnosis not present

## 2021-09-21 NOTE — Patient Instructions (Signed)
Medication Instructions:  No Changes In Medications at this time.  *If you need a refill on your cardiac medications before your next appointment, please call your pharmacy*  Follow-Up: At [facility], you and your health needs are our priority.  As part of our continuing mission to provide you with exceptional heart care, we have created designated Provider Care Teams.  These Care Teams include your primary Cardiologist (physician) and Advanced Practice Providers (APPs -  Physician Assistants and Nurse Practitioners) who all work together to provide you with the care you need, when you need it.  Your next appointment:   1 year(s)  The format for your next appointment:   In Person  Provider:   Gayatri A Acharya, MD          

## 2021-09-21 NOTE — Progress Notes (Signed)
Cardiology Office Note:    Date:  09/21/2021   ID:  Jade Daniels, DOB 1970-06-12, MRN [id number]  PCP:  Merrilee Seashore, MD  Cardiologist:  Elouise Munroe, MD  Electrophysiologist:  None   Referring MD: Merrilee Seashore, MD   Chief Complaint/Reason for Referral: Palpitations, HLD  History of Present Illness:    Jade Daniels is a 51 y.o. female with a history of hypertension, fibromyalgia, family history of coronary artery disease and recent evaluation for chest pain.  She is a non-smoker.  09/21/21: Overall doing well. The patient denies chest pain, chest pressure, dyspnea at rest or with exertion, palpitations, PND, orthopnea, or leg swelling. Denies cough, fever, chills. Denies nausea, vomiting. Denies syncope or presyncope. Denies dizziness or lightheadedness.  Reviewed normal CCTA, no plaque or calcium. Reviewed lipids, recommend aggressive diet and lifestyle modification.  09/26/20: Symptomatically feeling well.  Made some dietary adjustments from a GI perspective and has not had recurrence of worrisome palpitations.  We reviewed results of coronary calcium score, echocardiogram, cardiac monitor, and last lipid panel available to me from 2021.  Discussed low ASCVD risk with 0 calcium score and focus on diet and lifestyle modification for optimization of lipids which are currently above goal.  Palpitations and blood pressure both well controlled on metoprolol succinate 25 mg daily.  Her father is currently stage V renal disease not pursuing dialysis and her mother has leukemia.  This adds a significant amount of stress to her day.  She is managing this by trying to meditate.  We discussed options for yoga and other classes that may be beneficial for stress management.  The patient denies chest pain, chest pressure, dyspnea at rest or with exertion, palpitations, PND, orthopnea, or leg swelling. Denies cough, fever, chills. Denies nausea, vomiting. Denies syncope or  presyncope. Denies dizziness or lightheadedness.    03/18/20: She was seen by [facility] cardiovascular in November 2020.  She had a low risk stress test performed 11/29/2018.  She was started on metoprolol for angina and hypertension, but experienced dizziness and her dose was reduced to 12.5 mg twice daily with improvement in symptoms.  She was originally intended to have a coronary CTA but reported an allergy to contrast dye and this was deferred in favor of a nuclear stress test.   She has described palpitations in the past and belching after eating. She describes this as a flip-flopping in her chest.  On interview today she does not recall having palpitations in the past and feels that after having Covid in November palpitations have worsened.  She did require monoclonal antibodies for her Covid infection at that time.  She continues on metoprolol succinate 25 mg daily but notes palpitations primarily after eating.  She notes that she was eating a low-carb wrap and for 2 hours afterward would have belching and palpitations.  She has not had this food item in 1 week and will monitor how she feels.  She is also being tested for SIBO by her integrative practitioner due to belching.  She does occasionally have chest pain with episodes of stress.  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  Recall she has a low risk stress test from November 2020.  She is trying to manage her hyperlipidemia with diet and exercise.  LDL 133, triglycerides 196, HDL 53, VLDL 35.  She is not inclined to start statin therapy just yet and would like to try  continue diet modification.  She is currently following a keto diet and we discussed that this high-fat diet can contribute to hyperlipidemia.  She plans to transition from red meats to lean proteins to see if this will help.  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   metoprolol succinate (TOPROL-XL) 25 MG 24 hr tablet Take 1 tablet (25 mg total) by mouth daily. TAKE WITH OR IMMEDIATELY FOLLOWING A MEAL. (Patient taking differently: Take 12.5 mg by mouth daily. TAKE WITH OR IMMEDIATELY FOLLOWING A MEAL. 12.'5mg'$ )   nitroGLYCERIN (NITROSTAT) 0.4 MG SL tablet Place 1 tablet (0.4 mg total) under the tongue every 5 (five) minutes as needed for chest pain.   SEMAGLUTIDE PO Take by mouth.   [DISCONTINUED] metoprolol tartrate (LOPRESSOR) 50 MG tablet Take one tablet two hours prior to cardiac CT.     Allergies:   Azithromycin, Bactrim [sulfamethoxazole-trimethoprim], Erythromycin base, Lidocaine, Codeine, Ergocalciferol, and Synthroid [levothyroxine sodium]   Social History   Tobacco Use   Smoking status: Never   Smokeless tobacco: Never  Vaping Use   Vaping Use: Never used  Substance Use Topics   Alcohol use: Yes    Alcohol/week: 1.0 standard drink of alcohol    Types: 1 Glasses of wine per week    Comment: wine once/twice month   Drug use: No     Family History: The patient's family history includes Diabetes in her father and mother; Hypertension in her father; Kidney failure in her father; Leukemia in her mother; Thyroid disease in her father.  ROS:   Please see the history of present illness.    All other systems reviewed and are negative.  EKGs/Labs/Other Studies Reviewed:    The following studies were reviewed  today:  EKG:  NSR, unchanged.  Recent Labs: 06/26/2021: BUN 21; Creatinine, Ser 0.96; Hemoglobin 13.0; Platelets 319; Potassium 3.7; Sodium 139  Recent Lipid Panel    Component Value Date/Time   CHOL 196 11/17/2017 0831   TRIG 192 (H) 11/17/2017 0831   HDL 43 11/17/2017 0831   CHOLHDL 4.6 (H) 11/17/2017 0831   LDLCALC 115 (H) 11/17/2017 0831    Physical Exam:    VS:  BP 130/84   Ht '5\' 4"'$  (1.626 m)   Wt 171 lb 9.6 oz (77.8 kg)   LMP 10/13/2009   SpO2 98%   BMI 29.46 kg/m     Wt Readings from Last 5 Encounters:  09/21/21 171 lb 9.6 oz (77.8 kg)  07/03/21 174 lb 1.6 oz (79 kg)  06/26/21 170 lb (77.1 kg)  03/06/21 174 lb 9.6 oz (79.2 kg)  11/28/20 177 lb 8 oz (80.5 kg)    Constitutional: No acute distress Eyes: sclera non-icteric, normal conjunctiva and lids ENMT: normal dentition, moist mucous membranes Cardiovascular: regular rhythm, normal rate, no murmurs. S1 and S2 normal. Radial pulses  normal bilaterally. No jugular venous distention.  Respiratory: clear to auscultation bilaterally GI : normal bowel sounds, soft and nontender. No distention.   MSK: extremities warm, well perfused. No edema.  NEURO: grossly nonfocal exam, moves all extremities. PSYCH: alert and oriented x 3, normal mood and affect.     ASSESSMENT:    1. Palpitations      PLAN:    Palpitations-improved after dietary modification and continued use of metoprolol succinate.  Continue metoprolol succinate 12.5 mg daily.  Hyperlipidemia-with 0 calcium score, we discussed continued diet and lifestyle modification for the management of hyperlipidemia.  LDL is above goal of less than 70 and triglycerides are elevated above goal of less than 150.  She will make further changes. LDL 123, Trig 206.   Chest discomfort-nonischemic nuclear study in November 2020. Normal coronary CTA.  She feels this is stress related. Improved with metoprolol.   Hypertension-continue metoprolol succinate, blood pressure  well controlled.   Total time of encounter: 30 minutes total time of encounter, including 20 minutes spent in face-to-face patient care on the date of this encounter. This time includes coordination of care and counseling regarding above mentioned problem list. Remainder of non-face-to-face time involved reviewing chart documents/testing relevant to the patient encounter and documentation in the medical record. I have independently reviewed documentation from referring provider.   Cherlynn Kaiser, MD, [facility]      Medication Adjustments/Labs and Tests Ordered: Current medicines are reviewed at length with the patient today.  Concerns regarding medicines are outlined above.   Orders Placed This Encounter  Procedures   EKG 12-Lead     No orders of the defined types were placed in this encounter.    Patient Instructions  Medication Instructions:  No Changes In Medications at this time.  *If you need a refill on your cardiac medications before your next appointment, please call your pharmacy*  Follow-Up: At [facility], you and your health needs are our priority.  As part of our continuing mission to provide you with exceptional heart care, we have created designated Provider Care Teams.  These Care Teams include your primary Cardiologist (physician) and Advanced Practice Providers (APPs -  Physician Assistants and Nurse Practitioners) who all work together to provide you with the care you need, when you need it.  Your next appointment:   1 year(s)  The format for your next appointment:   In Person  Provider:   Elouise Munroe, MD

## 2021-10-01 ENCOUNTER — Encounter (HOSPITAL_BASED_OUTPATIENT_CLINIC_OR_DEPARTMENT_OTHER): Payer: Self-pay | Admitting: *Deleted

## 2022-08-19 ENCOUNTER — Telehealth: Payer: Self-pay

## 2022-08-19 ENCOUNTER — Ambulatory Visit: Payer: 59 | Attending: Internal Medicine | Admitting: Internal Medicine

## 2022-08-19 VITALS — BP 124/84 | HR 76 | Ht 64.0 in | Wt 178.2 lb

## 2022-08-19 DIAGNOSIS — R002 Palpitations: Secondary | ICD-10-CM | POA: Diagnosis not present

## 2022-08-19 DIAGNOSIS — I493 Ventricular premature depolarization: Secondary | ICD-10-CM | POA: Diagnosis not present

## 2022-08-19 DIAGNOSIS — R079 Chest pain, unspecified: Secondary | ICD-10-CM | POA: Diagnosis not present

## 2022-08-19 DIAGNOSIS — I10 Essential (primary) hypertension: Secondary | ICD-10-CM | POA: Diagnosis not present

## 2022-08-19 DIAGNOSIS — E782 Mixed hyperlipidemia: Secondary | ICD-10-CM | POA: Diagnosis not present

## 2022-08-19 NOTE — Progress Notes (Signed)
Cardiology Office Note:    Date:  08/19/2022   ID:  Jade Daniels, DOB 30-Apr-1970, MRN [id number]  PCP:  Georgianne Fick, MD  Cardiologist:  Parke Poisson, MD  Electrophysiologist:  None   Referring MD: Georgianne Fick, MD   Chief Complaint/Reason for Referral: Palpitations, HLD  History of Present Illness:    Jade Daniels is a 52 y.o. female with a history of hypertension, fibromyalgia, family history of coronary artery disease and recent evaluation for chest pain.  She is a non-smoker.  08/19/22: She is symptomatically well but is dealing with her husband who is a new diagnosis of lymphoma and parents to have end-stage medical issues.  She feels metoprolol at 12.5 mg controls her palpitations without dizziness.  She has been started on semaglutide by her naturopath and has had some weight loss and improve lipid panel, LDL closer to 100 per her report.  09/21/21: Overall doing well. The patient denies chest pain, chest pressure, dyspnea at rest or with exertion, palpitations, PND, orthopnea, or leg swelling. Denies cough, fever, chills. Denies nausea, vomiting. Denies syncope or presyncope. Denies dizziness or lightheadedness.  Reviewed normal CCTA, no plaque or calcium. Reviewed lipids, recommend aggressive diet and lifestyle modification.  09/26/20: Symptomatically feeling well.  Made some dietary adjustments from a GI perspective and has not had recurrence of worrisome palpitations.  We reviewed results of coronary calcium score, echocardiogram, cardiac monitor, and last lipid panel available to me from 2021.  Discussed low ASCVD risk with 0 calcium score and focus on diet and lifestyle modification for optimization of lipids which are currently above goal.  Palpitations and blood pressure both well controlled on metoprolol succinate 25 mg daily.  Her father is currently stage V renal disease not pursuing dialysis and her mother has leukemia.  This adds a significant amount of  stress to her day.  She is managing this by trying to meditate.  We discussed options for yoga and other classes that may be beneficial for stress management.  The patient denies chest pain, chest pressure, dyspnea at rest or with exertion, palpitations, PND, orthopnea, or leg swelling. Denies cough, fever, chills. Denies nausea, vomiting. Denies syncope or presyncope. Denies dizziness or lightheadedness.    03/18/20: She was seen by [facility] cardiovascular in November 2020.  She had a low risk stress test performed 11/29/2018.  She was started on metoprolol for angina and hypertension, but experienced dizziness and her dose was reduced to 12.5 mg twice daily with improvement in symptoms.  She was originally intended to have a coronary CTA but reported an allergy to contrast dye and this was deferred in favor of a nuclear stress test.   She has described palpitations in the past and belching after eating. She describes this as a flip-flopping in her chest.  On interview today she does not recall having palpitations in the past and feels that after having Covid in November palpitations have worsened.  She did require monoclonal antibodies for her Covid infection at that time.  She continues on metoprolol succinate 25 mg daily but notes palpitations primarily after eating.  She notes that she was eating a low-carb wrap and for 2 hours afterward would have belching and palpitations.  She has not had this food item in 1 week and will monitor how she feels.  She is also being tested for SIBO by her integrative practitioner due to belching.  She does occasionally have chest pain with episodes of stress.  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 and feels work-related stress contributes to chest discomfort.  Recall she has a low risk stress test from November 2020.  She is trying to manage her hyperlipidemia with diet and exercise.  LDL 133, triglycerides 196, HDL 53, VLDL 35.  She is not  inclined to start statin therapy just yet and would like to try continue diet modification.  She is currently following a keto diet and we discussed that this high-fat diet can contribute to hyperlipidemia.  She plans to transition from red meats to lean proteins to see if this will help.  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 TAKE WITH OR IMMEDIATELY FOLLOWING A MEAL. (Patient taking differently: Take 12.5 mg by mouth daily. TAKE WITH OR IMMEDIATELY FOLLOWING A MEAL. 12.5mg )   SEMAGLUTIDE PO Inject as directed.     Allergies:   Azithromycin, Bactrim [sulfamethoxazole-trimethoprim], Erythromycin base, Codeine, Ergocalciferol, Lidocaine, and Synthroid [levothyroxine sodium]   Social History   Tobacco Use   Smoking status: Never   Smokeless tobacco: Never  Vaping Use   Vaping status: Never Used  Substance Use Topics   Alcohol use: Yes    Alcohol/week: 1.0 standard drink of alcohol    Types: 1 Glasses of wine per week    Comment: wine once/twice month   Drug use: No     Family History: The patient's family history includes Diabetes in her father and mother; Hypertension in her father; Kidney failure in her father; Leukemia in her mother; Thyroid disease in her father.  ROS:   Please see the history of present illness.    All other systems reviewed and are negative.  EKGs/Labs/Other Studies Reviewed:     The following studies were reviewed today:  EKG:  EKG Interpretation Date/Time:  Thursday August 19 2022 08:40:08 EDT Ventricular Rate:  74 PR Interval:  144 QRS Duration:  74 QT Interval:  382 QTC Calculation: 424 R Axis:   52  Text Interpretation: Normal sinus rhythm Normal ECG Confirmed by Weston Brass ([id number]) on 08/19/2022 8:53:24 AM    Recent Labs: No results found for requested labs within last 365 days.  Recent Lipid Panel    Component Value Date/Time   CHOL 196 11/17/2017 0831   TRIG 192 (H) 11/17/2017 0831   HDL 43 11/17/2017 0831   CHOLHDL 4.6 (H) 11/17/2017 0831   LDLCALC 115 (H) 11/17/2017 0831    Physical Exam:    VS:  BP 124/84 (BP Location: Left Arm, Patient Position: Sitting, Cuff Size: Normal)   Pulse 76   Ht 5\' 4"  (1.626 m)   Wt 178 lb 3.2 oz (80.8 kg)   LMP 10/13/2009   SpO2 98%   BMI 30.59 kg/m     Wt Readings from Last 5 Encounters:  08/19/22 178  lb 3.2 oz (80.8 kg)  09/21/21 171 lb 9.6 oz (77.8 kg)  07/03/21 174 lb 1.6 oz (79 kg)  06/26/21 170 lb (77.1 kg)  03/06/21 174 lb 9.6 oz (79.2 kg)    Constitutional: No acute distress Eyes: sclera non-icteric, normal conjunctiva and lids ENMT: normal dentition, moist mucous membranes Cardiovascular: regular rhythm, normal rate, no murmurs. S1 and S2 normal. Radial pulses normal bilaterally. No jugular venous distention.  Respiratory: clear to auscultation bilaterally GI : normal bowel sounds, soft and nontender. No distention.   MSK: extremities warm, well perfused. No edema.  NEURO: grossly nonfocal exam, moves all extremities. PSYCH: alert and oriented x 3, normal mood and affect.     ASSESSMENT:    1. Palpitations   2. Essential hypertension   3. Mixed hyperlipidemia   4. Chest pain in adult   5. PVC (premature ventricular contraction)      PLAN:    Palpitations-improved after dietary modification and continued use of metoprolol succinate.  Continue metoprolol succinate 12.5 mg  daily.  Hyperlipidemia-with 0 calcium score, we discussed continued diet and lifestyle modification for the management of hyperlipidemia.  She would like to consider red yeast rice, will provide patient education from the [facility].  LDL is above goal of less than 70 and triglycerides are elevated above goal of less than 150.    Chest discomfort-no recurrence.  Nonischemic nuclear study in November 2020. Normal coronary CTA.  She feels this is stress related. Improved with metoprolol.   Hypertension-continue metoprolol succinate, blood pressure well controlled.   Total time of encounter: 25 minutes total time of encounter, including 15 minutes spent in face-to-face patient care on the date of this encounter. This time includes coordination of care and counseling regarding above mentioned problem list. Remainder of non-face-to-face time involved reviewing chart documents/testing relevant to the patient encounter and documentation in the medical record. I have independently reviewed documentation from referring provider.   Weston Brass, MD, [facility]  [facility]      Medication Adjustments/Labs and Tests Ordered: Current medicines are reviewed at length with the patient today.  Concerns regarding medicines are outlined above.   Orders Placed This Encounter  Procedures   EKG 12-Lead     No orders of the defined types were placed in this encounter.    Patient Instructions  Medication Instructions:  Your physician recommends that you continue on your current medications as directed. Please refer to the Current Medication list given to you today.  *If you need a refill on your cardiac medications before your next appointment, please call your pharmacy*    Follow-Up: At [facility], you and your health needs are our priority.  As part of our continuing mission to provide you with exceptional heart care, we have created designated Provider Care Teams.  These  Care Teams include your primary Cardiologist (physician) and Advanced Practice Providers (APPs -  Physician Assistants and Nurse Practitioners) who all work together to provide you with the care you need, when you need it.  We recommend signing up for the patient portal called "MyChart".  Sign up information is provided on this After Visit Summary.  MyChart is used to connect with patients for Virtual Visits (Telemedicine).  Patients are able to view lab/test results, encounter notes, upcoming appointments, etc.  Non-urgent messages can be sent to your provider as well.   To learn more about what you can do with MyChart, go to [email or web address].    Your next  appointment:   12 month(s)  Provider:   Parke Poisson, MD

## 2022-08-19 NOTE — Patient Instructions (Signed)
Medication Instructions:  Your physician recommends that you continue on your current medications as directed. Please refer to the Current Medication list given to you today.  *If you need a refill on your cardiac medications before your next appointment, please call your pharmacy*   Follow-Up: At [facility], you and your health needs are our priority.  As part of our continuing mission to provide you with exceptional heart care, we have created designated Provider Care Teams.  These Care Teams include your primary Cardiologist (physician) and Advanced Practice Providers (APPs -  Physician Assistants and Nurse Practitioners) who all work together to provide you with the care you need, when you need it.  We recommend signing up for the patient portal called "MyChart".  Sign up information is provided on this After Visit Summary.  MyChart is used to connect with patients for Virtual Visits (Telemedicine).  Patients are able to view lab/test results, encounter notes, upcoming appointments, etc.  Non-urgent messages can be sent to your provider as well.   To learn more about what you can do with MyChart, go to [email or web address].    Your next appointment:   12 month(s)  Provider:   Gayatri A Acharya, MD    

## 2022-09-01 ENCOUNTER — Other Ambulatory Visit (HOSPITAL_BASED_OUTPATIENT_CLINIC_OR_DEPARTMENT_OTHER): Payer: Self-pay | Admitting: Family

## 2022-09-01 DIAGNOSIS — R002 Palpitations: Secondary | ICD-10-CM

## 2022-09-01 DIAGNOSIS — I10 Essential (primary) hypertension: Secondary | ICD-10-CM

## 2022-11-03 NOTE — Telephone Encounter (Signed)
My chart message sent to pt.

## 2023-02-22 ENCOUNTER — Ambulatory Visit (HOSPITAL_BASED_OUTPATIENT_CLINIC_OR_DEPARTMENT_OTHER): Payer: 59 | Admitting: Obstetrics & Gynecology

## 2023-02-24 ENCOUNTER — Ambulatory Visit (HOSPITAL_BASED_OUTPATIENT_CLINIC_OR_DEPARTMENT_OTHER): Payer: 59 | Admitting: Obstetrics & Gynecology

## 2023-03-23 ENCOUNTER — Ambulatory Visit (INDEPENDENT_AMBULATORY_CARE_PROVIDER_SITE_OTHER): Admitting: Obstetrics & Gynecology

## 2023-03-23 ENCOUNTER — Encounter (HOSPITAL_BASED_OUTPATIENT_CLINIC_OR_DEPARTMENT_OTHER): Payer: Self-pay | Admitting: Obstetrics & Gynecology

## 2023-03-23 ENCOUNTER — Other Ambulatory Visit (HOSPITAL_COMMUNITY)
Admission: RE | Admit: 2023-03-23 | Discharge: 2023-03-23 | Disposition: A | Discharge status: Home / Self Care | Source: Ambulatory Visit | Attending: Obstetrics & Gynecology | Admitting: Obstetrics & Gynecology

## 2023-03-23 VITALS — BP 107/50 | HR 67 | Ht 64.75 in | Wt 173.4 lb

## 2023-03-23 DIAGNOSIS — Z1211 Encounter for screening for malignant neoplasm of colon: Secondary | ICD-10-CM

## 2023-03-23 DIAGNOSIS — N393 Stress incontinence (female) (male): Secondary | ICD-10-CM

## 2023-03-23 DIAGNOSIS — Z90710 Acquired absence of both cervix and uterus: Secondary | ICD-10-CM | POA: Diagnosis not present

## 2023-03-23 DIAGNOSIS — N898 Other specified noninflammatory disorders of vagina: Secondary | ICD-10-CM

## 2023-03-23 DIAGNOSIS — Z01419 Encounter for gynecological examination (general) (routine) without abnormal findings: Secondary | ICD-10-CM | POA: Diagnosis not present

## 2023-03-23 NOTE — Progress Notes (Signed)
 ANNUAL EXAM Patient name: Jade Daniels MRN [id number]  Date of birth: 07/25/1970 Chief Complaint:   Annual Exam  History of Present Illness:   Jade Daniels is a 53 y.o. [id number] Caucasian female being seen today for a routine annual exam.  Had a harder year this past year.  Father passed this past year.  He was 70.  Had renal failure.  Also, husband diagnosed with lymphoma.  He was on an immunosuppressant for psoriasis.  This was stopped.  Prior to starting chemotherapy, repeat imaging showed resolution.  Will have six month follow ups.  Being monitored by hematology/oncology.  All of this was really stressful.    She is having some urinary incontinence.  This is more significant if she has cough.  Is ready for some evaluation.  Wearing a pad right now all of the time.    Typically does blood work with naturopathic provider.  Does have planned follow scheduled.    Patient's last menstrual period was 10/13/2009.   Last pap 06/21/2016. Results were: NILM w/ HRHPV not done. H/O abnormal pap: no Last mammogram: 09/28/2021. Results were: normal. Family h/o breast cancer: no Last colonoscopy: never done.  Cologuard negative 03/2020. Results were: negative     03/23/2023    8:30 AM 03/06/2021   10:22 AM 03/05/2020   10:22 AM  Depression screen PHQ 2/9  Decreased Interest 0 0 0  Down, Depressed, Hopeless 0 0 0  PHQ - 2 Score 0 0 0     Review of Systems:   Pertinent items are noted in HPI  Denies vaginal bleeding, vaginal discharge, abdominal pain, urinary symptoms, bowel changes.  Pertinent History Reviewed:  Reviewed past medical,surgical, social and family history.  Reviewed problem list, medications and allergies. Physical Assessment:   Vitals:   03/23/23 0824  BP: (!) 107/50  Pulse: 67  Weight: 173 lb 6.4 oz (78.7 kg)  Height: 5' 4.75" (1.645 m)  Body mass index is 29.08 kg/m.        Physical Examination:   General appearance - well appearing, and in no  distress  Mental status - alert, oriented to person, place, and time  Psych:  She has a normal mood and affect  Skin - warm and dry, normal color, no suspicious lesions noted  Chest - effort normal, all lung fields clear to auscultation bilaterally  Heart - normal rate and regular rhythm  Neck:  midline trachea, no thyromegaly or nodules  Breasts - breasts appear normal, no suspicious masses, no skin or nipple changes or  axillary nodes  Abdomen - soft, nontender, nondistended, no masses or organomegaly  Pelvic - VULVA: normal appearing vulva with no masses, tenderness or lesions   VAGINA: normal appearing vagina with normal color and discharge, no lesions   CERVIX: normal appearing cervix without discharge or lesions, no CMT  Thin prep pap is not indicated  UTERUS: uterus is felt to be normal size, shape, consistency and nontender   ADNEXA: No adnexal masses or tenderness noted.  Rectal - normal rectal, good sphincter tone, no masses felt.   Extremities:  No swelling or varicosities noted  Chaperone present for exam, Ina Homes, CMA.  Assessment & Plan:  1. Well woman exam with routine gynecological exam (Primary) - Pap smear not indicated - Mammogram 2023.  She is going to work on scheduling this. - Colonoscopy declined but cologuard due so ordered. - lab work done with naturopathic provider - vaccines reviewed/updated  2. Urinary, incontinence, stress  female - Ambulatory referral to Urogynecology  3. Colon cancer screening - Cologuard  4. H/O: hysterectomy  5. Vaginal odor - Cervicovaginal ancillary only( [facility])   Meds: No orders of the defined types were placed in this encounter.   Follow-up: Return in about 1 year (around 03/22/2024).  Jerene Bears, MD 03/23/2023 9:36 AM

## 2023-03-24 LAB — CERVICOVAGINAL ANCILLARY ONLY
Bacterial Vaginitis (gardnerella): POSITIVE — AB
Candida Glabrata: NEGATIVE
Candida Vaginitis: NEGATIVE
Comment: NEGATIVE
Comment: NEGATIVE
Comment: NEGATIVE

## 2023-03-25 ENCOUNTER — Other Ambulatory Visit (HOSPITAL_BASED_OUTPATIENT_CLINIC_OR_DEPARTMENT_OTHER): Payer: Self-pay | Admitting: Obstetrics & Gynecology

## 2023-03-25 ENCOUNTER — Encounter (HOSPITAL_BASED_OUTPATIENT_CLINIC_OR_DEPARTMENT_OTHER): Payer: Self-pay | Admitting: Obstetrics & Gynecology

## 2023-03-25 ENCOUNTER — Other Ambulatory Visit (HOSPITAL_BASED_OUTPATIENT_CLINIC_OR_DEPARTMENT_OTHER): Payer: Self-pay | Admitting: *Deleted

## 2023-03-25 DIAGNOSIS — B9689 Other specified bacterial agents as the cause of diseases classified elsewhere: Secondary | ICD-10-CM

## 2023-03-25 MED ORDER — METRONIDAZOLE 0.75 % VA GEL: 1.0000 | Freq: Every day | VAGINAL | 0 refills | Status: DC

## 2023-03-25 MED ORDER — METRONIDAZOLE 500 MG PO TABS: 500.0000 mg | ORAL_TABLET | Freq: Two times a day (BID) | ORAL | 0 refills | Status: DC

## 2023-06-22 ENCOUNTER — Encounter (HOSPITAL_BASED_OUTPATIENT_CLINIC_OR_DEPARTMENT_OTHER): Payer: Self-pay | Admitting: *Deleted

## 2023-07-13 ENCOUNTER — Ambulatory Visit: Admitting: Obstetrics and Gynecology

## 2023-07-13 ENCOUNTER — Other Ambulatory Visit (HOSPITAL_COMMUNITY)
Admission: RE | Admit: 2023-07-13 | Discharge: 2023-07-13 | Disposition: A | Discharge status: Home / Self Care | Attending: Obstetrics and Gynecology | Admitting: Obstetrics and Gynecology

## 2023-07-13 ENCOUNTER — Encounter: Payer: Self-pay | Admitting: Obstetrics and Gynecology

## 2023-07-13 VITALS — BP 121/74 | HR 66 | Ht 64.8 in | Wt 175.0 lb

## 2023-07-13 DIAGNOSIS — R319 Hematuria, unspecified: Secondary | ICD-10-CM

## 2023-07-13 DIAGNOSIS — N393 Stress incontinence (female) (male): Secondary | ICD-10-CM | POA: Diagnosis present

## 2023-07-13 DIAGNOSIS — M6289 Other specified disorders of muscle: Secondary | ICD-10-CM | POA: Diagnosis not present

## 2023-07-13 LAB — POCT URINALYSIS DIP (CLINITEK)
Bilirubin, UA: NEGATIVE
Glucose, UA: NEGATIVE mg/dL
Ketones, POC UA: NEGATIVE mg/dL
Leukocytes, UA: NEGATIVE
Nitrite, UA: NEGATIVE
POC PROTEIN,UA: NEGATIVE
Spec Grav, UA: 1.025 (ref 1.010–1.025)
Urobilinogen, UA: 0.2 U/dL
pH, UA: 6 (ref 5.0–8.0)

## 2023-07-13 LAB — URINALYSIS, ROUTINE W REFLEX MICROSCOPIC
Bilirubin Urine: NEGATIVE
Glucose, UA: NEGATIVE mg/dL
Ketones, ur: NEGATIVE mg/dL
Leukocytes,Ua: NEGATIVE
Nitrite: NEGATIVE
Protein, ur: NEGATIVE mg/dL
Specific Gravity, Urine: 1.023 (ref 1.005–1.030)
pH: 6 (ref 5.0–8.0)

## 2023-07-13 NOTE — Patient Instructions (Signed)
 Consider options for treatment of leakage between pessary, bulkamid and surgical sling.

## 2023-07-13 NOTE — Progress Notes (Signed)
 [facility] New Patient Evaluation and Consultation  Referring Provider: Cleotilde Ronal RAMAN, MD PCP: Verdia Lombard, MD Date of Service: 07/13/2023  SUBJECTIVE Chief Complaint: New Patient (Initial Visit) Jade Daniels is a 53 y.o. female is here for urinary incontinence.)  History of Present Illness: Jade Daniels is a 53 y.o. White or Caucasian female seen in consultation at the request of Dr. Cleotilde for evaluation of SUI.    Review of records significant for: Hysterectomy with Endo hx Hx of hematuria with workup and cystoscopy done.   CT Renal Stone Study (05/26/2018)  EXAM: CT ABDOMEN AND PELVIS WITHOUT CONTRAST   TECHNIQUE: Multidetector CT imaging of the abdomen and pelvis was performed following the standard protocol without IV contrast.   COMPARISON:  None.   FINDINGS: Lower chest: Normal   Hepatobiliary: Liver parenchyma is normal.  No calcified gallstones.   Pancreas: Normal   Spleen: Normal   Adrenals/Urinary Tract: Adrenal glands are normal. Kidneys are normal. No cyst, mass, or hydronephrosis. Bladder is normal.   Stomach/Bowel: No visible bowel pathology.   Vascular/Lymphatic: Normal   Reproductive: Previous hysterectomy.  No pelvic mass.   Other: No free fluid or air.   Musculoskeletal: Normal. Clinical history reports some sort of lump at the left lower ribcage. Abnormality is visible by CT.   IMPRESSION: Negative study. No evidence of urinary tract stone disease or other pathology. No other significant finding. Previous hysterectomy.  Urinary Symptoms: Leaks urine with cough/ sneeze Leaks 3-4 time(s) per days.  Pad use: 4 pads per day.   Patient is bothered by UI symptoms.  Day time voids 3-4.  Nocturia: 1-2 times per night to void. Voiding dysfunction:  empties bladder well.  Patient does not use a catheter to empty bladder.  When urinating, patient feels she has no difficulties   UTIs: 0 UTI's in the last year.    Reports history of blood in urine No results found for the last 90 days.   Pelvic Organ Prolapse Symptoms:                  Patient Denies a feeling of a bulge the vaginal area.   Bowel Symptom: Bowel movements: 1 time(s) per day Stool consistency: soft  Straining: no.  Splinting: no.  Incomplete evacuation: no.  Patient Admits to accidental bowel leakage / fecal incontinence  Occurs: 1 time(s) per week  Consistency with leakage: soft  Bowel regimen: none Last colonoscopy: Date Never HM Colonoscopy   This patient has no relevant Health Maintenance data.     Sexual Function Sexually active: yes.  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 Last pap smear was 2/25.  Any history of abnormal pap smears: no. HM PAP   This patient has no relevant Health Maintenance data.     Medications: Patient has a current medication list which includes the following prescription(s): vitamin d3, cyanocobalamin, metoprolol  succinate, semaglutide, and nitroglycerin .   Allergies: Patient is allergic to azithromycin, bactrim  [sulfamethoxazole -trimethoprim ], erythromycin base, codeine, ergocalciferol , lidocaine, and synthroid  [levothyroxine sodium].   Social History:  Social History   Tobacco Use   Smoking status: Never   Smokeless tobacco: Never  Vaping Use   Vaping status: Never Used  Substance Use Topics   Alcohol use: Yes    Alcohol/week: 1.0 standard drink of alcohol    Types: 1 Glasses of wine per week    Comment: wine once/twice month   Drug use: No    Relationship status: married Patient lives with husband.   Patient is employed as a firector. Regular exercise: Yes: weightlifting x3 weekly History of abuse: No  Family History:   Family History  Problem Relation Age of Onset   Hypertension Father    Thyroid  disease Father    Diabetes Father    Kidney failure Father    Diabetes Mother    Leukemia Mother      Review of Systems: Review of Systems  Constitutional:  Negative for chills and fever.  Respiratory:  Positive for cough. Negative for shortness of breath.   Cardiovascular:  Negative for chest pain and palpitations.  Gastrointestinal:  Negative for abdominal pain, blood in stool, constipation and diarrhea.  Skin:  Negative for rash.  Neurological:  Positive for weakness.  Psychiatric/Behavioral:  Negative for depression and suicidal ideas.      OBJECTIVE Physical Exam: Vitals:   07/13/23 1017  BP: 121/74  Pulse: 66  Weight: 175 lb (79.4 kg)  Height: 5' 4.8 (1.646 m)    Physical Exam Vitals reviewed. Exam conducted with a chaperone present.  Constitutional:      Appearance: Normal appearance.  Pulmonary:     Effort: Pulmonary effort is normal.  Abdominal:     Palpations: Abdomen is soft.  Neurological:     General: No focal deficit present.     Mental Status: She is alert and oriented to person, place, and time.  Psychiatric:        Mood and Affect: Mood normal.        Behavior: Behavior normal. Behavior is cooperative.        Thought Content: Thought content normal.      GU / Detailed Urogynecologic Evaluation:  Pelvic Exam: Normal external female  genitalia; Bartholin's and Skene's glands normal in appearance; urethral meatus normal in appearance, no urethral masses or discharge.   CST: positive   s/p hysterectomy: Speculum exam reveals normal vaginal mucosa with  atrophy and normal vaginal cuff.  Adnexa normal adnexa.    With apex supported, anterior compartment defect was reduced  Pelvic floor strength I/V, poor muscle coordination   Pelvic floor musculature: Right levator non-tender, Right obturator non-tender, Left levator non-tender, Left obturator tender  POP-Q:   POP-Q  -2.5                                            Aa   -2.5  Ba  -7                                              C   2.5                                            Gh  4.5                                            Pb  8                                            tvl   -3                                            Ap  -3                                            Bp                                                 D      Rectal Exam:  Normal external exam.  Post-Void Residual (PVR) by Bladder Scan: In order to evaluate bladder emptying, we discussed obtaining a postvoid residual and patient agreed to this procedure.  Procedure: The ultrasound unit was placed on the patient's abdomen in the suprapubic region after the patient had voided.    Post Void Residual - 07/13/23 1428       Post Void Residual   Post Void Residual 20 mL           Laboratory Results: Lab Results  Component Value Date   COLORU yellow 07/13/2023   CLARITYU clear 07/13/2023   GLUCOSEUR negative 07/13/2023   BILIRUBINUR negative 07/13/2023   KETONESU Negative 06/03/2021   SPECGRAV 1.025 07/13/2023   RBCUR moderate (A) 07/13/2023   PHUR 6.0 07/13/2023   PROTEINUR Negative 06/03/2021   UROBILINOGEN 0.2 07/13/2023   LEUKOCYTESUR Negative 07/13/2023    Lab Results  Component Value Date   CREATININE 0.96  06/26/2021   CREATININE 0.84 11/04/2018   CREATININE 0.90 05/26/2018    Lab Results  Component Value Date   HGBA1C 5.5 11/17/2017    Lab Results  Component Value Date   HGB 13.0 06/26/2021     ASSESSMENT AND PLAN Jade Daniels is a 53 y.o. with:  1. SUI (stress urinary incontinence, female)   2. Pelvic floor dysfunction in female   3. Hematuria, unspecified type    For patient's SUI we discussed options of pessary, surgical sling, and urethral bulking as well as pros and cons to the options. Patient is  considering her options between sling and bulking. She has autoimmune disorders and is concerned about potential side effects with the mesh or with the bulkamid. Patient plans to do more research and will let us  know which direction she would like to proceed.  Patient has poor muscle control in her pelvic floor and cannot perform a proper kegel. She would benefit from pelvic floor physical therapy. Referral placed for PT.  Patient has had previous workups done for hematuria with no obvious cause. Will monitor and if patient has gross hematuria may consider US  of the kidneys. Last kidney imaging was in 2020 to rule out renal stones and was negative at that time.   Patient to send message when she has chosen which direction she would like to pursue for treatment of SUI.   Trinetta Alemu G Sacora Hawbaker, NP

## 2023-08-01 ENCOUNTER — Ambulatory Visit: Admitting: Physical Therapy

## 2023-08-26 ENCOUNTER — Telehealth (HOSPITAL_BASED_OUTPATIENT_CLINIC_OR_DEPARTMENT_OTHER): Payer: Self-pay

## 2023-08-26 DIAGNOSIS — Z1211 Encounter for screening for malignant neoplasm of colon: Secondary | ICD-10-CM

## 2023-08-26 NOTE — Telephone Encounter (Signed)
 Representative from [facility]olog[facility] left a message on the nurse line stating that they have received the patient's sample but the order was cancelled. Reports they need a new order placed for processing of the sample.  Patients last cologuard testing was performed 3/22. Cologuard ordered in EPIC.

## 2023-09-02 ENCOUNTER — Ambulatory Visit (HOSPITAL_BASED_OUTPATIENT_CLINIC_OR_DEPARTMENT_OTHER): Payer: Self-pay | Admitting: Obstetrics & Gynecology

## 2023-09-02 LAB — COLOGUARD: COLOGUARD: NEGATIVE

## 2023-09-23 NOTE — Therapy (Deleted)
 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   Past Surgical History:  Procedure Laterality Date   BREAST REDUCTION SURGERY  01/2015    eyelid surgery     HYSTEROSCOPY  04/2006   D&C    LAPAROSCOPIC TOTAL HYSTERECTOMY  11/10/09   TLH   LIPOSUCTION  01/2015    full torso    LIPOSUCTION  05/21/2016   lower face lift  04/2019   OVARIAN CYST REMOVAL  2008   Patient Active Problem List   Diagnosis Date Noted   Hashimoto's thyroiditis 03/05/2020   Hypertriglyceridemia 03/05/2020   HTN (hypertension) 12/06/2018   Fibromyalgia     PCP: Verdia Lombard, MD  REFERRING PROVIDER: Zuleta, Kaitlin G, NP   REFERRING DIAG:  N39.3 (ICD-10-CM) - SUI (stress urinary incontinence, female)  M62.89 (ICD-10-CM) - Pelvic floor dysfunction in female    THERAPY DIAG:  No diagnosis found.  Rationale for Evaluation and Treatment: Rehabilitation  ONSET DATE: ***  SUBJECTIVE:                                                                                                                                                                                           SUBJECTIVE STATEMENT: *** Fluid intake:   PAIN:  Are you having pain? {yes/no:20286} NPRS scale: ***/10 Pain location: {pelvic pain location:27098}  Pain type: {type:313116} Pain description: {PAIN DESCRIPTION:21022940}   Aggravating factors: *** Relieving factors: ***  PRECAUTIONS: {Therapy precautions:24002}  RED FLAGS: {PT Red Flags:29287}   WEIGHT BEARING RESTRICTIONS: No  FALLS:  Has patient fallen in last 6 months? {fallsyesno:27318}  OCCUPATION: ***  ACTIVITY LEVEL : ***  PLOF: {PLOF:24004}  PATIENT GOALS:  ***  PERTINENT HISTORY:  Fibromyalgia; Laparoscopic total hysterectomy Sexual abuse: {Yes/No:304960894}  BOWEL MOVEMENT: Pain with bowel movement: {yes/no:20286} Type of bowel movement:Type (Bristol Stool Scale) ***, Frequency 1 time per day, Strain no, and Splinting no Fully empty rectum: {No/Yes:304960894} Leakage: Yes: 1 time per week Pads: {Yes/No:304960894} Fiber supplement/laxative {YES/NO AS:20300}  URINATION: Pain with urination: {yes/no:20286} Fully empty bladder: {Yes/No:304960894}*** Stream: {PT urination:27102} Urgency: {YES/NO AS:20300} Frequency: *** Leakage: Coughing and Sneezing; leaks 3-4 times per day Pads: Yes: 4 pads per day  INTERCOURSE:  Ability to have vaginal penetration Yes  Pain with intercourse: none Dryness{YES/NO AS:20300} Climax: *** Marinoff Scale: ***/3 Laxative:  PREGNANCY: Vaginal deliveries 2 Tearing Yes: significant tearing with second baby Episiotomy {  YES/NO AS:20300} C-section deliveries *** Currently pregnant {Yes***/No:304960894}  PROLAPSE: {PT prolapse:27101}   OBJECTIVE:  Note: Objective measures were completed at Evaluation unless otherwise noted.  DIAGNOSTIC FINDINGS:  CST is positive Pelvic floor strength I/V, poor muscle coordination  Pelvic floor musculature: Right levator non-tender, Right obturator non-tender, Left levator non-tender, Left obturator tender  Post Void Residual 20 mL     PATIENT SURVEYS:  {rehab surveys:24030}  PFIQ-7: ***  COGNITION: Overall cognitive status: {cognition:24006}     SENSATION: Light touch: {intact/deficits:24005}  LUMBAR SPECIAL TESTS:  {lumbar special test:25242}  FUNCTIONAL TESTS:  {Functional tests:24029}  GAIT: Assistive device utilized: {Assistive devices:23999} Comments: ***  POSTURE: {posture:25561}   LUMBARAROM/PROM:  A/PROM A/PROM  eval  Flexion   Extension   Right lateral flexion   Left lateral flexion   Right rotation   Left rotation     (Blank rows = not tested)  LOWER EXTREMITY ROM:  {AROM/PROM:27142} ROM Right eval Left eval  Hip flexion    Hip extension    Hip abduction    Hip adduction    Hip internal rotation    Hip external rotation    Knee flexion    Knee extension    Ankle dorsiflexion    Ankle plantarflexion    Ankle inversion    Ankle eversion     (Blank rows = not tested)  LOWER EXTREMITY MMT:  MMT Right eval Left eval  Hip flexion    Hip extension    Hip abduction    Hip adduction    Hip internal rotation    Hip external rotation    Knee flexion    Knee extension    Ankle dorsiflexion    Ankle plantarflexion    Ankle inversion    Ankle eversion     (Blank rows = not tested) PALPATION:   General: ***  Pelvic Alignment: ***  Abdominal: ***                External Perineal Exam: ***                             Internal Pelvic Floor: ***  Patient confirms identification and approves PT to assess internal pelvic floor and treatment {yes/no:20286}  PELVIC MMT:   MMT eval  Vaginal   Internal Anal Sphincter   External Anal Sphincter   Puborectalis   Diastasis Recti   (Blank rows = not tested)        TONE: ***  PROLAPSE: ***  TODAY'S TREATMENT:                                                                                                                              DATE: ***  EVAL ***   PATIENT EDUCATION:  Education details: *** Person educated: {Person educated:25204} Education method: {Education Method:25205} Education comprehension: {Education Comprehension:25206}  HOME EXERCISE PROGRAM: ***  ASSESSMENT:  CLINICAL IMPRESSION: Patient  is a *** y.o. *** who was seen today for physical therapy evaluation and treatment for ***.   OBJECTIVE IMPAIRMENTS: {opptimpairments:25111}.   ACTIVITY LIMITATIONS: {activitylimitations:27494}  PARTICIPATION LIMITATIONS: {participationrestrictions:25113}  PERSONAL FACTORS: {Personal factors:25162} are also affecting  patient's functional outcome.   REHAB POTENTIAL: {rehabpotential:25112}  CLINICAL DECISION MAKING: {clinical decision making:25114}  EVALUATION COMPLEXITY: {Evaluation complexity:25115}   GOALS: Goals reviewed with patient? {yes/no:20286}  SHORT TERM GOALS: Target date: ***  *** Baseline: Goal status: INITIAL  2.  *** Baseline:  Goal status: INITIAL  3.  *** Baseline:  Goal status: INITIAL  4.  *** Baseline:  Goal status: INITIAL  5.  *** Baseline:  Goal status: INITIAL  6.  *** Baseline:  Goal status: INITIAL  LONG TERM GOALS: Target date: ***  *** Baseline:  Goal status: INITIAL  2.  *** Baseline:  Goal status: INITIAL  3.  *** Baseline:  Goal status: INITIAL  4.  *** Baseline:  Goal status: INITIAL  5.  *** Baseline:  Goal status: INITIAL  6.  *** Baseline:  Goal status: INITIAL  PLAN:  PT FREQUENCY: {rehab frequency:25116}  PT DURATION: {rehab duration:25117}  PLANNED INTERVENTIONS: {rehab planned interventions:25118::97110-Therapeutic exercises,97530- Therapeutic [phone]- Neuromuscular re-education,97535- Self Rjmz,02859- Manual therapy}  PLAN FOR NEXT SESSION: ***   Philmore Lepore, PT 09/23/2023, 9:10 AM

## 2023-09-26 ENCOUNTER — Ambulatory Visit: Admitting: Physical Therapy

## 2023-09-28 ENCOUNTER — Ambulatory Visit: Admitting: Pulmonary Disease

## 2023-09-28 ENCOUNTER — Other Ambulatory Visit (HOSPITAL_BASED_OUTPATIENT_CLINIC_OR_DEPARTMENT_OTHER): Payer: Self-pay | Admitting: Family

## 2023-09-28 DIAGNOSIS — I10 Essential (primary) hypertension: Secondary | ICD-10-CM

## 2023-09-28 DIAGNOSIS — R002 Palpitations: Secondary | ICD-10-CM

## 2023-09-30 ENCOUNTER — Telehealth: Payer: Self-pay | Admitting: Internal Medicine

## 2023-09-30 DIAGNOSIS — I10 Essential (primary) hypertension: Secondary | ICD-10-CM

## 2023-09-30 DIAGNOSIS — R002 Palpitations: Secondary | ICD-10-CM

## 2023-09-30 MED ORDER — METOPROLOL SUCCINATE ER 25 MG PO TB24: 25.0000 mg | ORAL_TABLET | Freq: Every day | ORAL | 0 refills | Status: DC

## 2023-09-30 NOTE — Telephone Encounter (Signed)
*  STAT* If patient is at the pharmacy, call can be transferred to refill team.   1. Which medications need to be refilled? (please list name of each medication and dose if known)   metoprolol  succinate (TOPROL -XL) 25 MG 24 hr tablet     2. Would you like to learn more about the convenience, safety, & potential cost savings by using the [facility]? No     3. Are you open to using the [facility] (Type [facility]. No   4. Which pharmacy/location (including street and city if local pharmacy) is medication to be sent to? [facility] [place], [place] - [address]     5. Do they need a 30 day or 90 day supply? 90 day  Pt is out of medication. Pt has appt. Scheduled 10/13/23

## 2023-09-30 NOTE — Telephone Encounter (Signed)
 Pt's medication was sent to pt's pharmacy as requested. Confirmation received.

## 2023-10-13 ENCOUNTER — Ambulatory Visit: Attending: Internal Medicine | Admitting: Internal Medicine

## 2023-10-13 ENCOUNTER — Encounter: Payer: Self-pay | Admitting: Internal Medicine

## 2023-10-13 VITALS — BP 140/84 | HR 74 | Ht 64.0 in | Wt 170.0 lb

## 2023-10-13 DIAGNOSIS — R002 Palpitations: Secondary | ICD-10-CM

## 2023-10-13 DIAGNOSIS — I10 Essential (primary) hypertension: Secondary | ICD-10-CM | POA: Diagnosis not present

## 2023-10-13 DIAGNOSIS — I493 Ventricular premature depolarization: Secondary | ICD-10-CM | POA: Diagnosis not present

## 2023-10-13 DIAGNOSIS — E782 Mixed hyperlipidemia: Secondary | ICD-10-CM

## 2023-10-13 DIAGNOSIS — Z79899 Other long term (current) drug therapy: Secondary | ICD-10-CM

## 2023-10-13 MED ORDER — METOPROLOL SUCCINATE ER 25 MG PO TB24: 25.0000 mg | ORAL_TABLET | Freq: Every day | ORAL | 3 refills | Status: AC

## 2023-10-13 NOTE — Patient Instructions (Signed)
 Medication Instructions:   Your physician recommends that you continue on your current medications as directed. Please refer to the Current Medication list given to you today.   *If you need a refill on your cardiac medications before your next appointment, please call your pharmacy*   Lab Work: NONE ORDERED  TODAY    If you have labs (blood work) drawn today and your tests are completely normal, you will receive your results only by: MyChart Message (if you have MyChart) OR A paper copy in the mail If you have any lab test that is abnormal or we need to change your treatment, we will call you to review the results.   Testing/Procedures: NONE ORDERED  TODAY    Follow-Up: At [facility], you and your health needs are our priority.  As part of our continuing mission to provide you with exceptional heart care, our providers are all part of one team.  This team includes your primary Cardiologist (physician) and Advanced Practice Providers or APPs (Physician Assistants and Nurse Practitioners) who all work together to provide you with the care you need, when you need it.  Your next appointment:    1 year(s)  Provider:    Soyla DELENA Merck, MD    We recommend signing up for the patient portal called MyChart.  Sign up information is provided on this After Visit Summary.  MyChart is used to connect with patients for Virtual Visits (Telemedicine).  Patients are able to view lab/test results, encounter notes, upcoming appointments, etc.  Non-urgent messages can be sent to your provider as well.   To learn more about what you can do with MyChart, go to [email or web address].   Other Instructions

## 2023-10-13 NOTE — Progress Notes (Signed)
  Cardiology Office Note:  .   Date:  10/13/2023  ID:  Jade Daniels, DOB [birth year], MRN [id number] PCP: Verdia Lombard, MD  [facility] Providers Cardiologist:  Soyla DELENA Merck, MD    History of Present Illness: .   Jade Daniels is a 53 y.o. female.  Discussed the use of AI scribe software for clinical note transcription with the patient, who gave verbal consent to proceed.  History of Present Illness Jade Daniels is a 53 year old female with hypertension who presents for a routine follow-up and medication refill.  She takes metoprolol  12.5 mg daily, splitting a 25 mg tablet. She needs a refill as her prescription is running low. Over the past year, she experienced significant stress as a caregiver, leading to panic attacks. Counseling and working from home have improved her mental state. She recently started a modified keto diet, losing seven pounds. Her family history includes her mother's leukemia and her father's kidney failure. She currently has no complaints and feels she is in a better place after the stressful period.    ROS: negative except per HPI above.  Studies Reviewed: SABRA   EKG Interpretation Date/Time:  Thursday October 13 2023 14:48:16 EDT Ventricular Rate:  70 PR Interval:  148 QRS Duration:  72 QT Interval:  374 QTC Calculation: 403 R Axis:   43  Text Interpretation: Normal sinus rhythm Normal ECG When compared with ECG of 19-Aug-2022 08:40, No significant change was found Confirmed by Merck Soyla ([id number]) on 10/13/2023 2:56:58 PM    Results LABS WBC: Normal (09/16/2023) Hb: Normal (09/16/2023) Hematocrit: Normal (09/16/2023) PLT: Normal (09/16/2023) Creatinine: Normal (09/16/2023) Sodium: Normal (09/16/2023) Potassium: Normal (09/16/2023) LDL: 141 (09/16/2023) Triglycerides: 191 (09/16/2023) Total Cholesterol: 225 (09/16/2023) HDL: 50 (09/16/2023) HbA1c: 5.5 (09/16/2023) C-reactive protein: 3.25 (09/16/2023)  RADIOLOGY Coronary  CT: No plaque, no calcium, no heart disease  DIAGNOSTIC EKG: Normal Risk Assessment/Calculations:       Physical Exam:   VS:  BP (!) 140/84   Pulse 74   Ht 5' 4 (1.626 m)   Wt 170 lb (77.1 kg)   LMP 10/13/2009   SpO2 97%   BMI 29.18 kg/m    Wt Readings from Last 3 Encounters:  10/13/23 170 lb (77.1 kg)  07/13/23 175 lb (79.4 kg)  03/23/23 173 lb 6.4 oz (78.7 kg)     Physical Exam GENERAL: Alert, cooperative, well developed, no acute distress. HEENT: Normocephalic, normal oropharynx, moist mucous membranes. CHEST: Clear to auscultation bilaterally, no wheezes, rhonchi, or crackles. CARDIOVASCULAR: Normal heart rate and rhythm, S1 and S2 normal with a faint heart murmur. ABDOMEN: Soft, non-tender, non-distended, without organomegaly, normal bowel sounds. EXTREMITIES: No cyanosis or edema. NEUROLOGICAL: Cranial nerves grossly intact, moves all extremities without gross motor or sensory deficit.   ASSESSMENT AND PLAN: .    Assessment and Plan Assessment & Plan Hyperlipidemia LDL cholesterol elevated at 141 mg/dL, triglycerides at 808 mg/dL. Previous LDL was 100 mg/dL. Calcium score zero, no arterial plaque. Elevated CRP likely due to tooth infection, not cardiovascular risk. - Recheck cholesterol levels in three months. - Consider red yeast rice per patient preference if cholesterol remains elevated. - Monitor CRP levels in six months, likely elevated due to acute infection.  Hypertension Palpitations Metoprolol  12.5 mg daily. - Refill metoprolol  12.5 mg for 1 year.         Soyla Merck, MD, FACC

## 2024-06-02 IMAGING — CR DG CHEST 2V
2 series · 2 of 2 positions shown · non-contrast
Comparison: None Available.

CLINICAL DATA: Left chest pain

EXAM:
CHEST - 2 VIEW

[w chest pa]
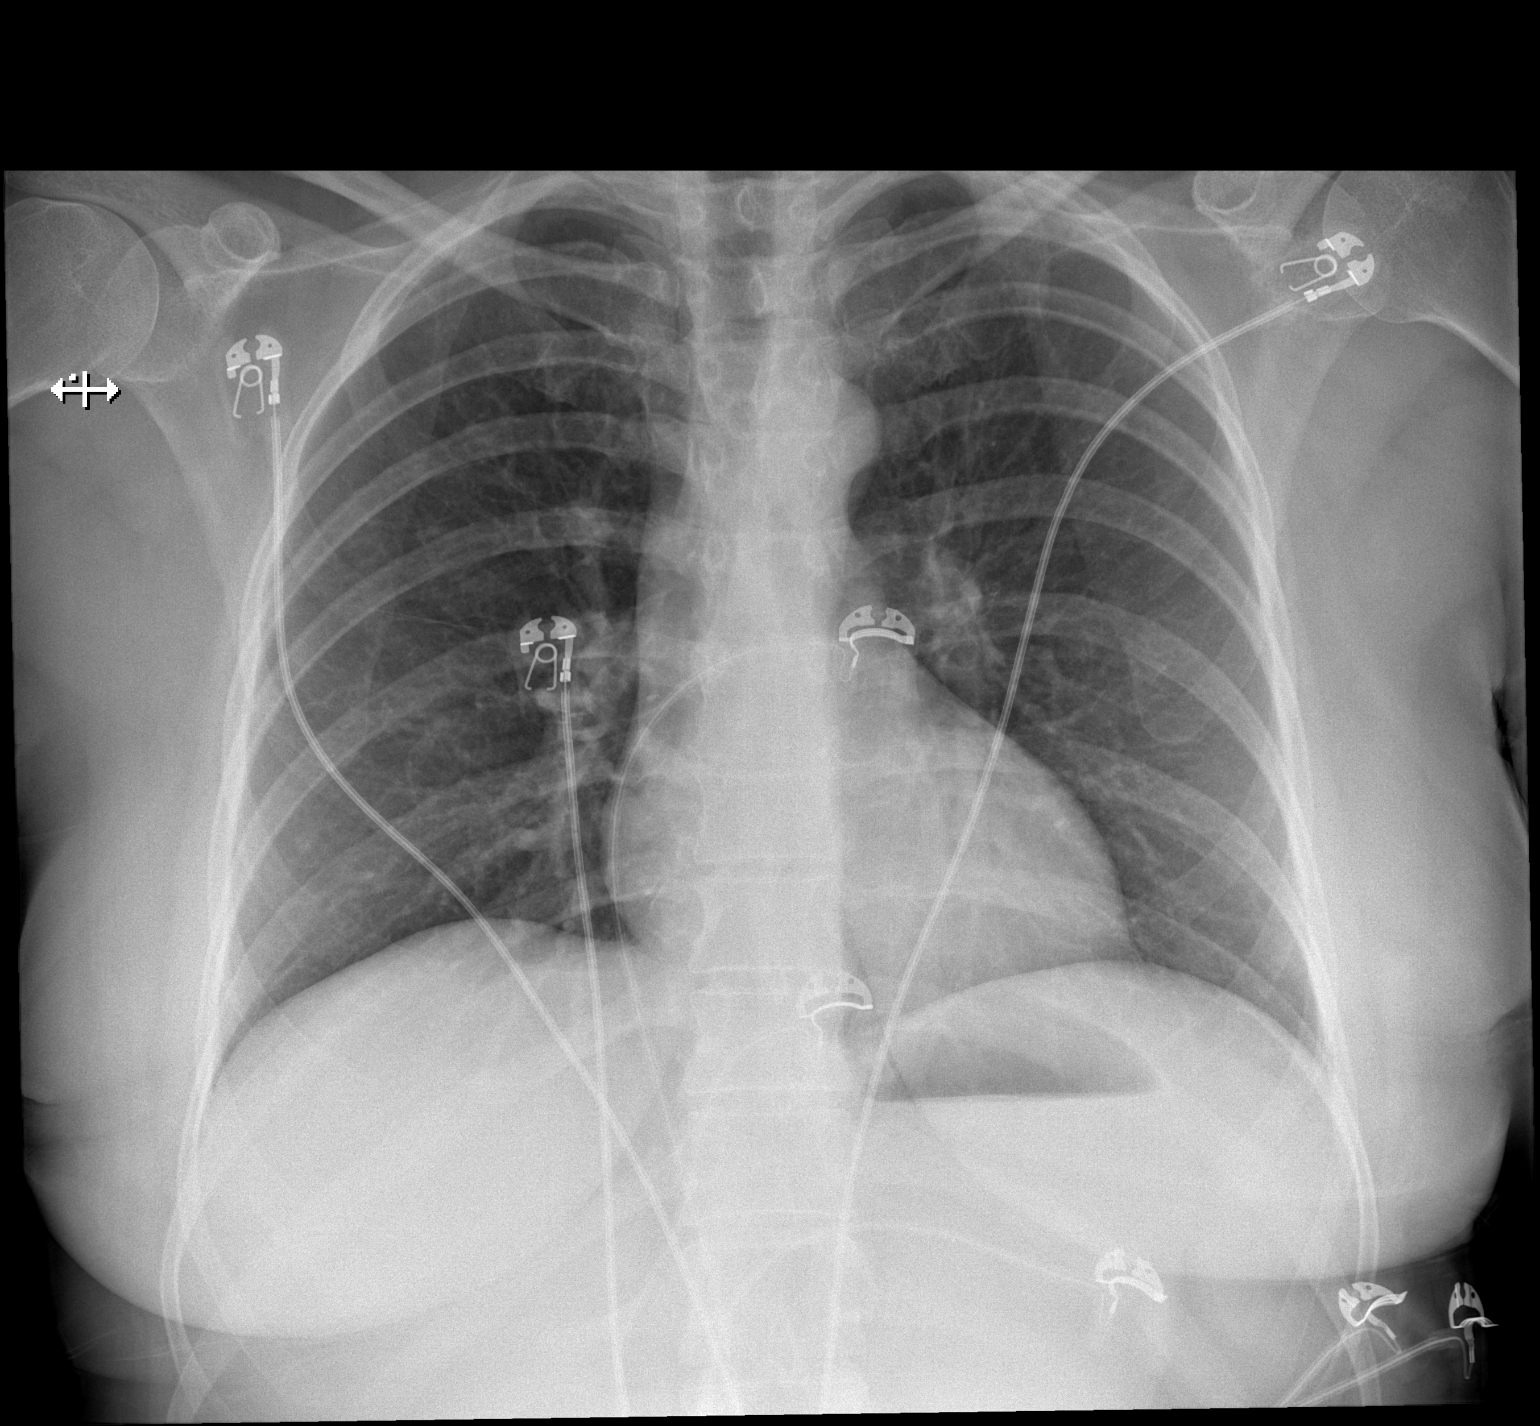

[w chest lat]
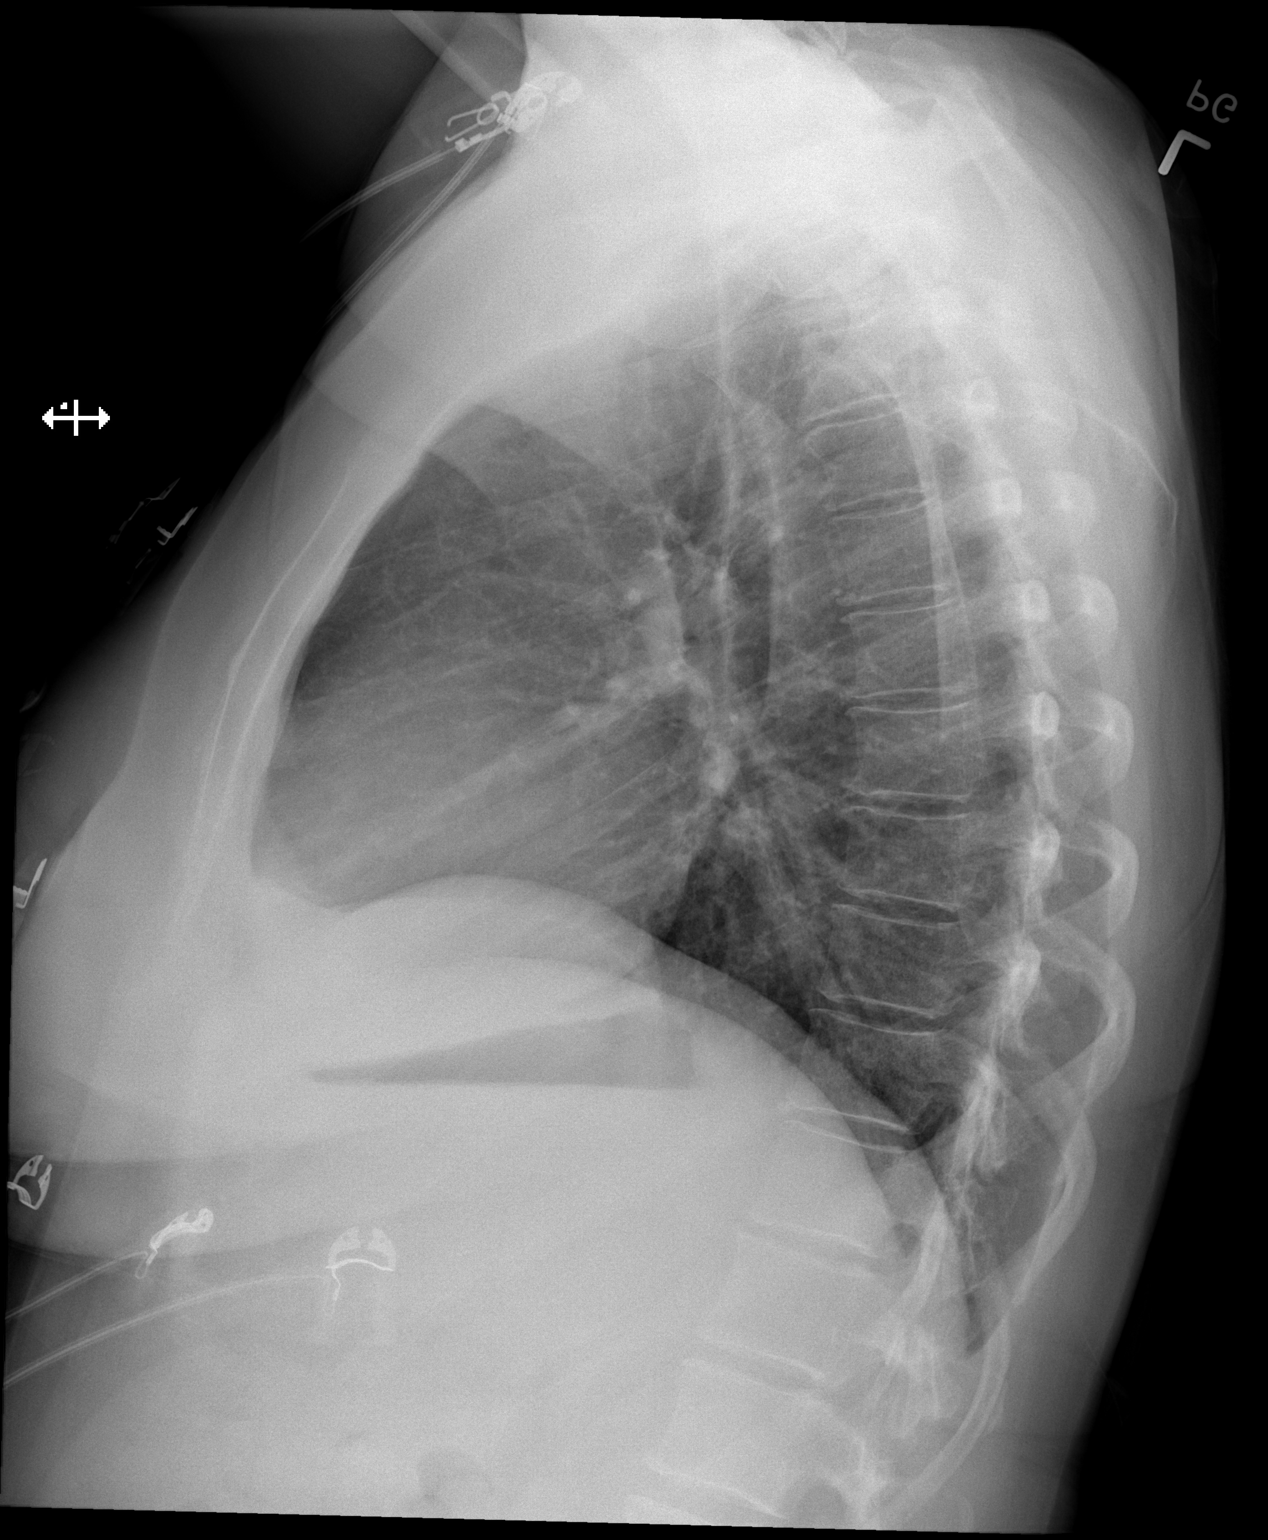

[2 of 2 positions shown; findings below may reference images not displayed]

FINDINGS: The heart size and mediastinal contours are within normal limits.
Both lungs are clear. The visualized skeletal structures are
unremarkable.
IMPRESSION: No active cardiopulmonary disease.
